# Patient Record
Sex: Female | Born: 1985 | Race: White | Hispanic: No | Marital: Married | Smoking: Never smoker
Health system: Southern US, Community
[De-identification: ages and names within clinical notes are randomized; demographics above are authoritative.]

## PROBLEM LIST (undated history)

## (undated) DIAGNOSIS — Z9889 Other specified postprocedural states: Secondary | ICD-10-CM

## (undated) DIAGNOSIS — R112 Nausea with vomiting, unspecified: Secondary | ICD-10-CM

## (undated) DIAGNOSIS — Z789 Other specified health status: Secondary | ICD-10-CM

## (undated) HISTORY — PX: NO PAST SURGERIES: SHX2092

---

## 2012-03-08 NOTE — L&D Delivery Note (Signed)
Delivery Note At 12:58 AM a viable and healthy female was delivered via Vaginal, Spontaneous Delivery (Presentation: Right occiput anterior;  ).  APGAR: 9, 9; weight pending .   Placenta status: Intact, Spontaneous.  Cord:  3 vessel with the following complications: none .  Cord pH: n/a  Anesthesia: Epidural  Episiotomy: None Lacerations: None Suture Repair: none Est. Blood Loss (mL): 350  Mom to postpartum.  Baby to nursery-stable.  [name] 09/20/2012, 1:20 AM

## 2012-03-08 NOTE — L&D Delivery Note (Signed)
I have seen and examined this patient and I agree with the above. Cam Hai 1:27 AM 09/20/2012

## 2012-03-27 ENCOUNTER — Other Ambulatory Visit (HOSPITAL_COMMUNITY)
Admission: RE | Admit: 2012-03-27 | Discharge: 2012-03-27 | Disposition: A | Discharge status: Home / Self Care | Payer: Medicaid Other | Source: Ambulatory Visit | Attending: Obstetrics and Gynecology | Admitting: Obstetrics and Gynecology

## 2012-03-27 DIAGNOSIS — Z113 Encounter for screening for infections with a predominantly sexual mode of transmission: Secondary | ICD-10-CM | POA: Insufficient documentation

## 2012-03-27 DIAGNOSIS — Z01419 Encounter for gynecological examination (general) (routine) without abnormal findings: Secondary | ICD-10-CM | POA: Insufficient documentation

## 2012-03-27 LAB — OB RESULTS CONSOLE VARICELLA ZOSTER ANTIBODY, IGG: Varicella: IMMUNE

## 2012-03-27 LAB — OB RESULTS CONSOLE ABO/RH: RH Type: POSITIVE

## 2012-03-27 LAB — OB RESULTS CONSOLE HEPATITIS B SURFACE ANTIGEN: Hepatitis B Surface Ag: NEGATIVE

## 2012-03-27 LAB — OB RESULTS CONSOLE RUBELLA ANTIBODY, IGM: Rubella: IMMUNE

## 2012-04-09 ENCOUNTER — Emergency Department (HOSPITAL_COMMUNITY)
Admission: EM | Admit: 2012-04-09 | Discharge: 2012-04-09 | Disposition: A | Discharge status: Home / Self Care | Payer: Medicaid Other | Attending: Emergency Medicine | Admitting: Emergency Medicine

## 2012-04-09 ENCOUNTER — Emergency Department (HOSPITAL_COMMUNITY): Payer: Medicaid Other

## 2012-04-09 ENCOUNTER — Encounter (HOSPITAL_COMMUNITY): Payer: Self-pay | Admitting: Emergency Medicine

## 2012-04-09 DIAGNOSIS — O2390 Unspecified genitourinary tract infection in pregnancy, unspecified trimester: Secondary | ICD-10-CM | POA: Insufficient documentation

## 2012-04-09 DIAGNOSIS — O209 Hemorrhage in early pregnancy, unspecified: Secondary | ICD-10-CM | POA: Insufficient documentation

## 2012-04-09 DIAGNOSIS — O4690 Antepartum hemorrhage, unspecified, unspecified trimester: Secondary | ICD-10-CM

## 2012-04-09 DIAGNOSIS — B9689 Other specified bacterial agents as the cause of diseases classified elsewhere: Secondary | ICD-10-CM

## 2012-04-09 DIAGNOSIS — N760 Acute vaginitis: Secondary | ICD-10-CM | POA: Insufficient documentation

## 2012-04-09 DIAGNOSIS — O200 Threatened abortion: Secondary | ICD-10-CM | POA: Insufficient documentation

## 2012-04-09 LAB — URINALYSIS, ROUTINE W REFLEX MICROSCOPIC
Hgb urine dipstick: NEGATIVE
Leukocytes, UA: NEGATIVE
Nitrite: NEGATIVE
Specific Gravity, Urine: 1.01 (ref 1.005–1.030)
Urobilinogen, UA: 0.2 mg/dL (ref 0.0–1.0)

## 2012-04-09 LAB — CBC WITH DIFFERENTIAL/PLATELET
Basophils Absolute: 0 10*3/uL (ref 0.0–0.1)
Basophils Relative: 0 % (ref 0–1)
MCHC: 33.6 g/dL (ref 30.0–36.0)
Monocytes Absolute: 0.7 10*3/uL (ref 0.1–1.0)
Neutro Abs: 5.4 10*3/uL (ref 1.7–7.7)
Neutrophils Relative %: 66 % (ref 43–77)
RDW: 14.1 % (ref 11.5–15.5)

## 2012-04-09 LAB — COMPREHENSIVE METABOLIC PANEL
AST: 13 U/L (ref 0–37)
Albumin: 3.1 g/dL — ABNORMAL LOW (ref 3.5–5.2)
Chloride: 102 mEq/L (ref 96–112)
Creatinine, Ser: 0.51 mg/dL (ref 0.50–1.10)
Potassium: 3.4 mEq/L — ABNORMAL LOW (ref 3.5–5.1)
Total Bilirubin: 0.1 mg/dL — ABNORMAL LOW (ref 0.3–1.2)

## 2012-04-09 LAB — PROTIME-INR: INR: 0.87 (ref 0.00–1.49)

## 2012-04-09 LAB — WET PREP, GENITAL

## 2012-04-09 MED ORDER — METRONIDAZOLE 500 MG PO TABS
2000.0000 mg | ORAL_TABLET | Freq: Once | ORAL | Status: AC
  Administered 2012-04-09: 2000 mg via ORAL
  Filled 2012-04-09: qty 4

## 2012-04-09 NOTE — ED Provider Notes (Signed)
History     CSN: [id number]  Arrival date & time 04/09/12  1714   First MD Initiated Contact with Patient 04/09/12 1736      Chief Complaint  Patient presents with  . Vaginal Bleeding    (Consider location/radiation/quality/duration/timing/severity/associated sxs/prior treatment) HPI Comments: Patient reports vaginal spotting for the past day that is dark red. She is [redacted] weeks gestation confirming ultrasound. States on 2 days ago she had some pink discharge which resolved. Today she saw some dark spots with clots. Denies any abdominal pain, cramps, nausea, vomiting, fever, fluid rest. No dysuria or hematuria. No chest pain or shortness of breath. She is G 3 P2. She has seen someone at [facility].  The history is provided by the patient.    History reviewed. No pertinent past medical history.  History reviewed. No pertinent past surgical history.  No family history on file.  History  Substance Use Topics  . Smoking status: Not on file  . Smokeless tobacco: Not on file  . Alcohol Use: No    OB History    Grav Para Term Preterm Abortions TAB SAB Ect Mult Living   3 2 2       2       Review of Systems  Constitutional: Negative for fever, activity change and appetite change.  HENT: Negative for congestion and rhinorrhea.   Respiratory: Negative for cough, chest tightness and shortness of breath.   Cardiovascular: Negative for chest pain.  Gastrointestinal: Negative for nausea, vomiting and abdominal pain.  Genitourinary: Positive for vaginal bleeding. Negative for dysuria and hematuria.  Musculoskeletal: Negative for back pain.  Skin: Negative for rash.  Neurological: Negative for dizziness and weakness.    Allergies  Review of patient's allergies indicates no known allergies.  Home Medications   Current Outpatient Rx  Name  Route  Sig  Dispense  Refill  . PRENATAL 27-0.8 MG PO TABS   Oral   Take 1 tablet by mouth daily.           BP 109/82   Pulse 121  Temp 98 F (36.7 C) (Oral)  Resp 24  Ht 5' (1.524 m)  Wt 123 lb (55.792 kg)  BMI 24.02 kg/m2  SpO2 100%  LMP 12/08/2011  Physical Exam  Constitutional: She is oriented to person, place, and time. She appears well-developed and well-nourished. No distress.  HENT:  Head: Normocephalic and atraumatic.  Mouth/Throat: Oropharynx is clear and moist. No oropharyngeal exudate.  Eyes: Conjunctivae normal and EOM are normal. Pupils are equal, round, and reactive to light.  Neck: Normal range of motion. Neck supple.  Cardiovascular: Normal rate, regular rhythm and normal heart sounds.   No murmur heard.      Tachycardic  Pulmonary/Chest: Effort normal and breath sounds normal. No respiratory distress.  Abdominal: Soft. There is no tenderness. There is no rebound and no guarding.       Gravid abdomen  Genitourinary: There is no tenderness on the right labia. There is no tenderness on the left labia. Cervix exhibits no motion tenderness. Right adnexum displays no mass and no tenderness. Left adnexum displays no mass and no tenderness. No vaginal discharge found.       No blood in vaginal vault, no bleeding  Musculoskeletal: Normal range of motion. She exhibits no edema and no tenderness.  Neurological: She is alert and oriented to person, place, and time. No cranial nerve deficit. Coordination normal.  Skin: Skin is warm.    ED Course  Procedures (including critical care time)  Labs Reviewed  COMPREHENSIVE METABOLIC PANEL - Abnormal; Notable for the following:    Potassium 3.4 (*)     Glucose, Bld 112 (*)     Albumin 3.1 (*)     Alkaline Phosphatase 37 (*)     All other components within normal limits  CBC WITH DIFFERENTIAL  PROTIME-INR  ABO/RH  URINALYSIS, ROUTINE W REFLEX MICROSCOPIC  GC/CHLAMYDIA PROBE AMP  WET PREP, GENITAL   US Ob Limited  04/09/2012  *RADIOLOGY REPORT*  Clinical Data: Pregnant patient with abdominal pain and bleeding.  LIMITED OBSTETRIC ULTRASOUND   Number of Fetuses: 1 Heart Rate: 144 bpm Movement: Present  Presentation: Breech Placental Location: Posterior right Previa: None Amniotic Fluid (Subjective): Normal  Vertical Pocket 4.0 cm  BPD:  3.9 cm      18 w  0 d         EDC:  09/12/2012.  MATERNAL FINDINGS: Cervix: Closed.  Normal. Uterus/Adnexae:  Normal.  IMPRESSION: Single viable intrauterine pregnancy.  No complicating features. Estimated gestational age [redacted] weeks 0 days.  Recommend followup with non-emergent complete OB 14+ wk US examination for fetal biometric evaluation and anatomic survey if not already performed.   Original Report Authenticated By: Andreas Newport, M.D.      No diagnosis found.    MDM  G3 P2 is [redacted] weeks gestation confirming by outpatient ultrasound on record not available presenting with vaginal spotting for the past day. No cramps, gush of fluid, fever chills or vomiting.   Ultrasound shows viable fetus at 18 weeks' gestation. Heart rate 144.  Hemoglobin 12. A+, not rhogam candidate.  Patient's reassuring workup discussed with patient and husband. Discussed with Dr. Emelda Fear who the patient to call first thing in the morning to be seen tomorrow.  Heart rate has improved to 90s on recheck. Patient denies any abdominal pain, chest pain or shortness of breath.  To call Dr. Emelda Fear tomorrow for an appointment.   Glynn Octave, MD 04/09/12 [phone]

## 2012-04-09 NOTE — ED Notes (Signed)
Pt [redacted] weeks pregnant. Pt spotting intermittently since Friday, but worse today.

## 2012-04-11 LAB — GC/CHLAMYDIA PROBE AMP
CT Probe RNA: NEGATIVE
GC Probe RNA: NEGATIVE

## 2012-05-09 ENCOUNTER — Encounter: Payer: Self-pay | Admitting: *Deleted

## 2012-06-05 ENCOUNTER — Encounter: Payer: Self-pay | Admitting: Obstetrics & Gynecology

## 2012-06-27 ENCOUNTER — Ambulatory Visit (INDEPENDENT_AMBULATORY_CARE_PROVIDER_SITE_OTHER): Payer: Medicaid Other | Admitting: Obstetrics & Gynecology

## 2012-06-27 VITALS — BP 108/60 | Wt 132.0 lb

## 2012-06-27 DIAGNOSIS — Z3480 Encounter for supervision of other normal pregnancy, unspecified trimester: Secondary | ICD-10-CM | POA: Insufficient documentation

## 2012-06-27 DIAGNOSIS — Z331 Pregnant state, incidental: Secondary | ICD-10-CM

## 2012-06-27 DIAGNOSIS — Z1389 Encounter for screening for other disorder: Secondary | ICD-10-CM

## 2012-06-27 DIAGNOSIS — O09299 Supervision of pregnancy with other poor reproductive or obstetric history, unspecified trimester: Secondary | ICD-10-CM

## 2012-06-27 DIAGNOSIS — Z3483 Encounter for supervision of other normal pregnancy, third trimester: Secondary | ICD-10-CM

## 2012-06-27 LAB — POCT URINALYSIS DIPSTICK
Blood, UA: NEGATIVE
Ketones, UA: NEGATIVE
Protein, UA: NEGATIVE

## 2012-06-27 NOTE — Progress Notes (Signed)
Pressure left abdomen

## 2012-06-27 NOTE — Progress Notes (Signed)
BP weight and urine results all reviewed and noted. Patient reports good fetal movement, denies any bleeding and no rupture of membranes symptoms or regular contractions. Patient is without complaints. All questions were answered.  

## 2012-06-27 NOTE — Patient Instructions (Signed)
Pregnancy - Third Trimester  The third trimester of pregnancy (the last 3 months) is a period of the most rapid growth for you and your baby. The baby approaches a length of 20 inches and a weight of 6 to 10 pounds. The baby is adding on fat and getting ready for life outside your body. While inside, babies have periods of sleeping and waking, suck their thumbs, and hiccups. You can often feel small contractions of the uterus. This is false labor. It is also called Braxton-Hicks contractions. This is like a practice for labor. The usual problems in this stage of pregnancy include more difficulty breathing, swelling of the hands and feet from water retention, and having to urinate more often because of the uterus and baby pressing on your bladder.   PRENATAL EXAMS  · Blood work may continue to be done during prenatal exams. These tests are done to check on your health and the probable health of your baby. Blood work is used to follow your blood levels (hemoglobin). Anemia (low hemoglobin) is common during pregnancy. Iron and vitamins are given to help prevent this. You may also continue to be checked for diabetes. Some of the past blood tests may be done again.  · The size of the uterus is measured during each visit. This makes sure your baby is growing properly according to your pregnancy dates.  · Your blood pressure is checked every prenatal visit. This is to make sure you are not getting toxemia.  · Your urine is checked every prenatal visit for infection, diabetes and protein.  · Your weight is checked at each visit. This is done to make sure gains are happening at the suggested rate and that you and your baby are growing normally.  · Sometimes, an ultrasound is performed to confirm the position and the proper growth and development of the baby. This is a test done that bounces harmless sound waves off the baby so your caregiver can more accurately determine due dates.  · Discuss the type of pain medication and  anesthesia you will have during your labor and delivery.  · Discuss the possibility and anesthesia if a Cesarean Section might be necessary.  · Inform your caregiver if there is any mental or physical violence at home.  Sometimes, a specialized non-stress test, contraction stress test and biophysical profile are done to make sure the baby is not having a problem. Checking the amniotic fluid surrounding the baby is called an amniocentesis. The amniotic fluid is removed by sticking a needle into the belly (abdomen). This is sometimes done near the end of pregnancy if an early delivery is required. In this case, it is done to help make sure the baby's lungs are mature enough for the baby to live outside of the womb. If the lungs are not mature and it is unsafe to deliver the baby, an injection of cortisone medication is given to the mother 1 to 2 days before the delivery. This helps the baby's lungs mature and makes it safer to deliver the baby.  CHANGES OCCURING IN THE THIRD TRIMESTER OF PREGNANCY  Your body goes through many changes during pregnancy. They vary from person to person. Talk to your caregiver about changes you notice and are concerned about.  · During the last trimester, you have probably had an increase in your appetite. It is normal to have cravings for certain foods. This varies from person to person and pregnancy to pregnancy.  · You may begin to   get stretch marks on your hips, abdomen, and breasts. These are normal changes in the body during pregnancy. There are no exercises or medications to take which prevent this change.  · Constipation may be treated with a stool softener or adding bulk to your diet. Drinking lots of fluids, fiber in vegetables, fruits, and whole grains are helpful.  · Exercising is also helpful. If you have been very active up until your pregnancy, most of these activities can be continued during your pregnancy. If you have been less active, it is helpful to start an exercise  program such as walking. Consult your caregiver before starting exercise programs.  · Avoid all smoking, alcohol, un-prescribed drugs, herbs and "street drugs" during your pregnancy. These chemicals affect the formation and growth of the baby. Avoid chemicals throughout the pregnancy to ensure the delivery of a healthy infant.  · Backache, varicose veins and hemorrhoids may develop or get worse.  · You will tire more easily in the third trimester, which is normal.  · The baby's movements may be stronger and more often.  · You may become short of breath easily.  · Your belly button may stick out.  · A yellow discharge may leak from your breasts called colostrum.  · You may have a bloody mucus discharge. This usually occurs a few days to a week before labor begins.  HOME CARE INSTRUCTIONS   · Keep your caregiver's appointments. Follow your caregiver's instructions regarding medication use, exercise, and diet.  · During pregnancy, you are providing food for you and your baby. Continue to eat regular, well-balanced meals. Choose foods such as meat, fish, milk and other low fat dairy products, vegetables, fruits, and whole-grain breads and cereals. Your caregiver will tell you of the ideal weight gain.  · A physical sexual relationship may be continued throughout pregnancy if there are no other problems such as early (premature) leaking of amniotic fluid from the membranes, vaginal bleeding, or belly (abdominal) pain.  · Exercise regularly if there are no restrictions. Check with your caregiver if you are unsure of the safety of your exercises. Greater weight gain will occur in the last 2 trimesters of pregnancy. Exercising helps:  · Control your weight.  · Get you in shape for labor and delivery.  · You lose weight after you deliver.  · Rest a lot with legs elevated, or as needed for leg cramps or low back pain.  · Wear a good support or jogging bra for breast tenderness during pregnancy. This may help if worn during  sleep. Pads or tissues may be used in the bra if you are leaking colostrum.  · Do not use hot tubs, steam rooms, or saunas.  · Wear your seat belt when driving. This protects you and your baby if you are in an accident.  · Avoid raw meat, cat litter boxes and soil used by cats. These carry germs that can cause birth defects in the baby.  · It is easier to loose urine during pregnancy. Tightening up and strengthening the pelvic muscles will help with this problem. You can practice stopping your urination while you are going to the bathroom. These are the same muscles you need to strengthen. It is also the muscles you would use if you were trying to stop from passing gas. You can practice tightening these muscles up 10 times a set and repeating this about 3 times per day. Once you know what muscles to tighten up, do not perform these   exercises during urination. It is more likely to cause an infection by backing up the urine.  · Ask for help if you have financial, counseling or nutritional needs during pregnancy. Your caregiver will be able to offer counseling for these needs as well as refer you for other special needs.  · Make a list of emergency phone numbers and have them available.  · Plan on getting help from family or friends when you go home from the hospital.  · Make a trial run to the hospital.  · Take prenatal classes with the father to understand, practice and ask questions about the labor and delivery.  · Prepare the baby's room/nursery.  · Do not travel out of the city unless it is absolutely necessary and with the advice of your caregiver.  · Wear only low or no heal shoes to have better balance and prevent falling.  MEDICATIONS AND DRUG USE IN PREGNANCY  · Take prenatal vitamins as directed. The vitamin should contain 1 milligram of folic acid. Keep all vitamins out of reach of children. Only a couple vitamins or tablets containing iron may be fatal to a baby or young child when ingested.  · Avoid use  of all medications, including herbs, over-the-counter medications, not prescribed or suggested by your caregiver. Only take over-the-counter or prescription medicines for pain, discomfort, or fever as directed by your caregiver. Do not use aspirin, ibuprofen (Motrin®, Advil®, Nuprin®) or naproxen (Aleve®) unless OK'd by your caregiver.  · Let your caregiver also know about herbs you may be using.  · Alcohol is related to a number of birth defects. This includes fetal alcohol syndrome. All alcohol, in any form, should be avoided completely. Smoking will cause low birth rate and premature babies.  · Street/illegal drugs are very harmful to the baby. They are absolutely forbidden. A baby born to an addicted mother will be addicted at birth. The baby will go through the same withdrawal an adult does.  SEEK MEDICAL CARE IF:  You have any concerns or worries during your pregnancy. It is better to call with your questions if you feel they cannot wait, rather than worry about them.  DECISIONS ABOUT CIRCUMCISION  You may or may not know the sex of your baby. If you know your baby is a boy, it may be time to think about circumcision. Circumcision is the removal of the foreskin of the penis. This is the skin that covers the sensitive end of the penis. There is no proven medical need for this. Often this decision is made on what is popular at the time or based upon religious beliefs and social issues. You can discuss these issues with your caregiver or pediatrician.  SEEK IMMEDIATE MEDICAL CARE IF:   · An unexplained oral temperature above 102° F (38.9° C) develops, or as your caregiver suggests.  · You have leaking of fluid from the vagina (birth canal). If leaking membranes are suspected, take your temperature and tell your caregiver of this when you call.  · There is vaginal spotting, bleeding or passing clots. Tell your caregiver of the amount and how many pads are used.  · You develop a bad smelling vaginal discharge with  a change in the color from clear to white.  · You develop vomiting that lasts more than 24 hours.  · You develop chills or fever.  · You develop shortness of breath.  · You develop burning on urination.  · You loose more than 2 pounds of weight   or gain more than 2 pounds of weight or as suggested by your caregiver.  · You notice sudden swelling of your face, hands, and feet or legs.  · You develop belly (abdominal) pain. Round ligament discomfort is a common non-cancerous (benign) cause of abdominal pain in pregnancy. Your caregiver still must evaluate you.  · You develop a severe headache that does not go away.  · You develop visual problems, blurred or double vision.  · If you have not felt your baby move for more than 1 hour. If you think the baby is not moving as much as usual, eat something with sugar in it and lie down on your left side for an hour. The baby should move at least 4 to 5 times per hour. Call right away if your baby moves less than that.  · You fall, are in a car accident or any kind of trauma.  · There is mental or physical violence at home.  Document Released: 02/16/2001 Document Revised: 05/17/2011 Document Reviewed: 08/21/2008  [employer]® Patient Information ©2013 [employer].

## 2012-07-05 ENCOUNTER — Other Ambulatory Visit: Payer: Medicaid Other

## 2012-07-05 DIAGNOSIS — Z3490 Encounter for supervision of normal pregnancy, unspecified, unspecified trimester: Secondary | ICD-10-CM

## 2012-07-06 LAB — CBC
HCT: 37 % (ref 36.0–46.0)
MCH: 30.9 pg (ref 26.0–34.0)
MCV: 94.6 fL (ref 78.0–100.0)
Platelets: 147 10*3/uL — ABNORMAL LOW (ref 150–400)
RDW: 13.1 % (ref 11.5–15.5)

## 2012-07-06 LAB — HSV 2 ANTIBODY, IGG: HSV 2 Glycoprotein G Ab, IgG: <0.1 IV

## 2012-07-06 LAB — ANTIBODY SCREEN: Antibody Screen: NEGATIVE

## 2012-07-06 LAB — GLUCOSE TOLERANCE, 2 HOURS W/ 1HR: Glucose, 1 hour: 158 mg/dL (ref 70–170)

## 2012-07-18 ENCOUNTER — Encounter: Payer: Medicaid Other | Admitting: Women's Health

## 2012-07-25 ENCOUNTER — Encounter: Payer: Medicaid Other | Admitting: Women's Health

## 2012-08-03 ENCOUNTER — Ambulatory Visit (INDEPENDENT_AMBULATORY_CARE_PROVIDER_SITE_OTHER): Payer: Medicaid Other | Admitting: Obstetrics and Gynecology

## 2012-08-03 VITALS — BP 118/80 | Wt 130.8 lb

## 2012-08-03 DIAGNOSIS — O09299 Supervision of pregnancy with other poor reproductive or obstetric history, unspecified trimester: Secondary | ICD-10-CM

## 2012-08-03 DIAGNOSIS — Z3493 Encounter for supervision of normal pregnancy, unspecified, third trimester: Secondary | ICD-10-CM

## 2012-08-03 DIAGNOSIS — Z1389 Encounter for screening for other disorder: Secondary | ICD-10-CM

## 2012-08-03 DIAGNOSIS — Z331 Pregnant state, incidental: Secondary | ICD-10-CM

## 2012-08-03 LAB — POCT URINALYSIS DIPSTICK
Blood, UA: NEGATIVE
Glucose, UA: NEGATIVE
Nitrite, UA: NEGATIVE

## 2012-08-03 NOTE — Progress Notes (Signed)
Tightness around "belly button" no bleeding. More fetal activity. IMP: normal pregnancy fetal activity. Good FM, NO c/o. A: routine Prenatal visit. jvf

## 2012-08-17 ENCOUNTER — Encounter: Payer: Self-pay | Admitting: Advanced Practice Midwife

## 2012-08-17 ENCOUNTER — Ambulatory Visit (INDEPENDENT_AMBULATORY_CARE_PROVIDER_SITE_OTHER): Payer: Medicaid Other | Admitting: Advanced Practice Midwife

## 2012-08-17 VITALS — BP 108/80 | Wt 132.0 lb

## 2012-08-17 DIAGNOSIS — O09299 Supervision of pregnancy with other poor reproductive or obstetric history, unspecified trimester: Secondary | ICD-10-CM

## 2012-08-17 DIAGNOSIS — Z1389 Encounter for screening for other disorder: Secondary | ICD-10-CM

## 2012-08-17 DIAGNOSIS — Z3483 Encounter for supervision of other normal pregnancy, third trimester: Secondary | ICD-10-CM

## 2012-08-17 DIAGNOSIS — Z331 Pregnant state, incidental: Secondary | ICD-10-CM

## 2012-08-17 LAB — POCT URINALYSIS DIPSTICK
Blood, UA: NEGATIVE
Glucose, UA: NEGATIVE
Nitrite, UA: NEGATIVE
Protein, UA: NEGATIVE

## 2012-08-17 LAB — OB RESULTS CONSOLE GC/CHLAMYDIA
Chlamydia: NEGATIVE
Gonorrhea: NEGATIVE

## 2012-08-17 NOTE — Addendum Note (Signed)
Addended by: Colen Darling on: 08/17/2012 12:07 PM   Modules accepted: Orders

## 2012-08-17 NOTE — Progress Notes (Signed)
HAVING A LOT OF VAGINAL PRESSURE. Reassured this is normal.   No c/o at this time.  GBS collectedRoutine questions about pregnancy answered.  F/U in 1 weeks for LROB.

## 2012-08-22 ENCOUNTER — Ambulatory Visit (INDEPENDENT_AMBULATORY_CARE_PROVIDER_SITE_OTHER): Payer: Medicaid Other | Admitting: Obstetrics & Gynecology

## 2012-08-22 ENCOUNTER — Encounter: Payer: Self-pay | Admitting: Obstetrics & Gynecology

## 2012-08-22 VITALS — BP 90/60 | Wt 133.0 lb

## 2012-08-22 DIAGNOSIS — O09299 Supervision of pregnancy with other poor reproductive or obstetric history, unspecified trimester: Secondary | ICD-10-CM

## 2012-08-22 DIAGNOSIS — Z331 Pregnant state, incidental: Secondary | ICD-10-CM

## 2012-08-22 DIAGNOSIS — Z1389 Encounter for screening for other disorder: Secondary | ICD-10-CM

## 2012-08-22 LAB — POCT URINALYSIS DIPSTICK
Glucose, UA: NEGATIVE
Ketones, UA: NEGATIVE
Protein, UA: NEGATIVE

## 2012-08-22 LAB — CULTURE, BETA STREP (GROUP B ONLY)

## 2012-08-22 NOTE — Patient Instructions (Signed)
Breastfeeding A change in hormones during your pregnancy causes growth of your breast tissue and an increase in number and size of milk ducts. The hormone prolactin allows proteins, sugars, and fats from your blood supply to make breast milk in your milk-producing glands. The hormone progesterone prevents breast milk from being released before the birth of your baby. After the birth of your baby, your progesterone level decreases allowing breast milk to be released. Thoughts of your baby, as well as his or her sucking or crying, can stimulate the release of milk from the milk-producing glands. Deciding to breastfeed (nurse) is one of the best choices you can make for you and your baby. The information that follows gives a brief review of the benefits, as well as other important skills to know about breastfeeding. BENEFITS OF BREASTFEEDING For your baby  The first milk (colostrum) helps your baby's digestive system function better.   There are antibodies in your milk that help your baby fight off infections.   Your baby has a lower incidence of asthma, allergies, and sudden infant death syndrome (SIDS).   The nutrients in breast milk are better for your baby than infant formulas.  Breast milk improves your baby's brain development.   Your baby will have less gas, colic, and constipation.  Your baby is less likely to develop other conditions, such as childhood obesity, asthma, or diabetes mellitus. For you  Breastfeeding helps develop a very special bond between you and your baby.   Breastfeeding is convenient, always available at the correct temperature, and costs nothing.   Breastfeeding helps to burn calories and helps you lose the weight gained during pregnancy.   Breastfeeding makes your uterus contract back down to normal size faster and slows bleeding following delivery.   Breastfeeding mothers have a lower risk of developing osteoporosis or breast or ovarian cancer later  in life.  BREASTFEEDING FREQUENCY  A healthy, full-term baby may breastfeed as often as every hour or space his or her feedings to every 3 hours. Breastfeeding frequency will vary from baby to baby.   Newborns should be fed no less than every 2 3 hours during the day and every 4 5 hours during the night. You should breastfeed a minimum of 8 feedings in a 24 hour period.  Awaken your baby to breastfeed if it has been 3 4 hours since the last feeding.  Breastfeed when you feel the need to reduce the fullness of your breasts or when your newborn shows signs of hunger. Signs that your baby may be hungry include:  Increased alertness or activity.  Stretching.  Movement of the head from side to side.  Movement of the head and opening of the mouth when the corner of the mouth or cheek is stroked (rooting).  Increased sucking sounds, smacking lips, cooing, sighing, or squeaking.  Hand-to-mouth movements.  Increased sucking of fingers or hands.  Fussing.  Intermittent crying.  Signs of extreme hunger will require calming and consoling before you try to feed your baby. Signs of extreme hunger may include:  Restlessness.  A loud, strong cry.  Screaming.  Frequent feeding will help you make more milk and will help prevent problems, such as sore nipples and engorgement of the breasts.  BREASTFEEDING   Whether lying down or sitting, be sure that the baby's abdomen is facing your abdomen.   Support your breast with 4 fingers under your breast and your thumb above your nipple. Make sure your fingers are well away from   your nipple and your baby's mouth.   Stroke your baby's lips gently with your finger or nipple.   When your baby's mouth is open wide enough, place all of your nipple and as much of the colored area around your nipple (areola) as possible into your baby's mouth.  More areola should be visible above his or her upper lip than below his or her lower lip.  Your  baby's tongue should be between his or her lower gum and your breast.  Ensure that your baby's mouth is correctly positioned around the nipple (latched). Your baby's lips should create a seal on your breast.  Signs that your baby has effectively latched onto your nipple include:  Tugging or sucking without pain.  Swallowing heard between sucks.  Absent click or smacking sound.  Muscle movement above and in front of his or her ears with sucking.  Your baby must suck about 2 3 minutes in order to get your milk. Allow your baby to feed on each breast as long as he or she wants. Nurse your baby until he or she unlatches or falls asleep at the first breast, then offer the second breast.  Signs that your baby is full and satisfied include:  A gradual decrease in the number of sucks or complete cessation of sucking.  Falling asleep.  Extension or relaxation of his or her body.  Retention of a small amount of milk in his or her mouth.  Letting go of your breast by himself or herself.  Signs of effective breastfeeding in you include:  Breasts that have increased firmness, weight, and size prior to feeding.  Breasts that are softer after nursing.  Increased milk volume, as well as a change in milk consistency and color by the 5th day of breastfeeding.  Breast fullness relieved by breastfeeding.  Nipples are not sore, cracked, or bleeding.  If needed, break the suction by putting your finger into the corner of your baby's mouth and sliding your finger between his or her gums. Then, remove your breast from his or her mouth.  It is common for babies to spit up a small amount after a feeding.  Babies often swallow air during feeding. This can make babies fussy. Burping your baby between breasts can help with this.  Vitamin D supplements are recommended for babies who get only breast milk.  Avoid using a pacifier during your baby's first 4 6 weeks.  Avoid supplemental feedings of  water, formula, or juice in place of breastfeeding. Breast milk is all the food your baby needs. It is not necessary for your baby to have water or formula. Your breasts will make more milk if supplemental feedings are avoided during the early weeks. HOW TO TELL WHETHER YOUR BABY IS GETTING ENOUGH BREAST MILK Wondering whether or not your baby is getting enough milk is a common concern among mothers. You can be assured that your baby is getting enough milk if:   Your baby is actively sucking and you hear swallowing.   Your baby seems relaxed and satisfied after a feeding.   Your baby nurses at least 8 12 times in a 24 hour time period.  During the first 3 5 days of age:  Your baby is wetting at least 3 5 diapers in a 24 hour period. The urine should be clear and pale yellow.  Your baby is having at least 3 4 stools in a 24 hour period. The stool should be soft and yellow.  At   5 7 days of age, your baby is having at least 3 6 stools in a 24 hour period. The stool should be seedy and yellow by 5 days of age.  Your baby has a weight loss less than 7 10% during the first 3 days of age.  Your baby does not lose weight after 3 7 days of age.  Your baby gains 4 7 ounces each week after he or she is 4 days of age.  Your baby gains weight by 5 days of age and is back to birth weight within 2 weeks. ENGORGEMENT In the first week after your baby is born, you may experience extremely full breasts (engorgement). When engorged, your breasts may feel heavy, warm, or tender to the touch. Engorgement peaks within 24 48 hours after delivery of your baby.  Engorgement may be reduced by:  Continuing to breastfeed.  Increasing the frequency of breastfeeding.  Taking warm showers or applying warm, moist heat to your breasts just before each feeding. This increases circulation and helps the milk flow.   Gently massaging your breast before and during the feedings. With your fingertips, massage from  your chest wall towards your nipple in a circular motion.   Ensuring that your baby empties at least one breast at every feeding. It also helps to start the next feeding on the opposite breast.   Expressing breast milk by hand or by using a breast pump to empty the breasts if your baby is sleepy, or not nursing well. You may also want to express milk if you are returning to work oryou feel you are getting engorged.  Ensuring your baby is latched on and positioned properly while breastfeeding. If you follow these suggestions, your engorgement should improve in 24 48 hours. If you are still experiencing difficulty, call your lactation consultant or caregiver.  CARING FOR YOURSELF Take care of your breasts.  Bathe or shower daily.   Avoid using soap on your nipples.   Wear a supportive bra. Avoid wearing underwire style bras.  Air dry your nipples for a 3 4minutes after each feeding.   Use only cotton bra pads to absorb breast milk leakage. Leaking of breast milk between feedings is normal.   Use only pure lanolin on your nipples after nursing. You do not need to wash it off before feeding your baby again. Another option is to express a few drops of breast milk and gently massage that milk into your nipples.  Continue breast self-awareness checks. Take care of yourself.  Eat healthy foods. Alternate 3 meals with 3 snacks.  Avoid foods that you notice affect your baby in a bad way.  Drink milk, fruit juice, and water to satisfy your thirst (about 8 glasses a day).   Rest often, relax, and take your prenatal vitamins to prevent fatigue, stress, and anemia.  Avoid chewing and smoking tobacco.  Avoid alcohol and drug use.  Take over-the-counter and prescribed medicine only as directed by your caregiver or pharmacist. You should always check with your caregiver or pharmacist before taking any new medicine, vitamin, or herbal supplement.  Know that pregnancy is possible while  breastfeeding. If desired, talk to your caregiver about family planning and safe birth control methods that may be used while breastfeeding. SEEK MEDICAL CARE IF:   You feel like you want to stop breastfeeding or have become frustrated with breastfeeding.  You have painful breasts or nipples.  Your nipples are cracked or bleeding.  Your breasts are red, tender,   or warm.  You have a swollen area on either breast.  You have a fever or chills.  You have nausea or vomiting.  You have drainage from your nipples.  Your breasts do not become full before feedings by the 5th day after delivery.  You feel sad and depressed.  Your baby is too sleepy to eat well.  Your baby is having trouble sleeping.   Your baby is wetting less than 3 diapers in a 24 hour period.  Your baby has less than 3 stools in a 24 hour period.  Your baby's skin or the white part of his or her eyes becomes more yellow.   Your baby is not gaining weight by 5 days of age. MAKE SURE YOU:   Understand these instructions.  Will watch your condition.  Will get help right away if you are not doing well or get worse. Document Released: 02/22/2005 Document Revised: 11/17/2011 Document Reviewed: 09/29/2011 [employer] Patient Information 2014 [employer].  

## 2012-08-22 NOTE — Progress Notes (Signed)
BP weight and urine results all reviewed and noted. Patient reports good fetal movement, denies any bleeding and no rupture of membranes symptoms or regular contractions. Patient is without complaints. All questions were answered. GBS positive

## 2012-08-25 ENCOUNTER — Encounter: Payer: Medicaid Other | Admitting: Obstetrics & Gynecology

## 2012-08-29 ENCOUNTER — Encounter: Payer: Medicaid Other | Admitting: Advanced Practice Midwife

## 2012-09-04 ENCOUNTER — Encounter: Payer: Medicaid Other | Admitting: Obstetrics & Gynecology

## 2012-09-05 ENCOUNTER — Encounter: Payer: Self-pay | Admitting: Obstetrics & Gynecology

## 2012-09-05 ENCOUNTER — Ambulatory Visit (INDEPENDENT_AMBULATORY_CARE_PROVIDER_SITE_OTHER): Payer: Medicaid Other | Admitting: Obstetrics & Gynecology

## 2012-09-05 VITALS — BP 100/80 | Wt 132.0 lb

## 2012-09-05 DIAGNOSIS — Z3480 Encounter for supervision of other normal pregnancy, unspecified trimester: Secondary | ICD-10-CM

## 2012-09-05 DIAGNOSIS — Z3483 Encounter for supervision of other normal pregnancy, third trimester: Secondary | ICD-10-CM

## 2012-09-05 DIAGNOSIS — Z1389 Encounter for screening for other disorder: Secondary | ICD-10-CM

## 2012-09-05 DIAGNOSIS — O09299 Supervision of pregnancy with other poor reproductive or obstetric history, unspecified trimester: Secondary | ICD-10-CM

## 2012-09-05 LAB — POCT URINALYSIS DIPSTICK
Glucose, UA: NEGATIVE
Ketones, UA: NEGATIVE
Leukocytes, UA: NEGATIVE
Nitrite, UA: NEGATIVE

## 2012-09-05 NOTE — Progress Notes (Signed)
HAVING IRREGULAR CONTRACTIONS. HAD SOME DIARRHEA THIS MORNING.

## 2012-09-05 NOTE — Progress Notes (Signed)
BP weight and urine results all reviewed and noted. Patient reports good fetal movement, denies any bleeding and no rupture of membranes symptoms or regular contractions. Patient is without complaints. All questions were answered.  

## 2012-09-05 NOTE — Patient Instructions (Signed)

## 2012-09-13 ENCOUNTER — Telehealth: Payer: Self-pay | Admitting: Obstetrics & Gynecology

## 2012-09-13 NOTE — Telephone Encounter (Signed)
Voice mailbox not set up yet. JSY

## 2012-09-13 NOTE — Telephone Encounter (Signed)
Spoke with pt. Pt concerned about baby moving a lot. Every so often baby moves constant for 5 minutes. Having some contractions and she sometimes notices the movement then. Contractions will be every 5 minutes then goes to every 8 minutes. If contractions come regular, every 5-7 minutes x 1 hour, [facility]. Advised everything sounds ok at this point. Advised to bring this up at tomorrow's appt. Pt voiced understanding. JSY

## 2012-09-14 ENCOUNTER — Ambulatory Visit (INDEPENDENT_AMBULATORY_CARE_PROVIDER_SITE_OTHER): Payer: Medicaid Other | Admitting: Obstetrics and Gynecology

## 2012-09-14 VITALS — BP 106/64 | Wt 134.2 lb

## 2012-09-14 DIAGNOSIS — Z331 Pregnant state, incidental: Secondary | ICD-10-CM

## 2012-09-14 DIAGNOSIS — Z3493 Encounter for supervision of normal pregnancy, unspecified, third trimester: Secondary | ICD-10-CM

## 2012-09-14 DIAGNOSIS — O09299 Supervision of pregnancy with other poor reproductive or obstetric history, unspecified trimester: Secondary | ICD-10-CM

## 2012-09-14 DIAGNOSIS — Z1389 Encounter for screening for other disorder: Secondary | ICD-10-CM

## 2012-09-14 LAB — POCT URINALYSIS DIPSTICK
Blood, UA: NEGATIVE
Glucose, UA: NEGATIVE
Leukocytes, UA: NEGATIVE
Nitrite, UA: NEGATIVE

## 2012-09-14 NOTE — Progress Notes (Signed)
No complaints at this time. Good fm, Will schedule IOL for 41 weeks if no spont labor by next WednesdayIOL scheduled for 7:30 AM next wednesday

## 2012-09-14 NOTE — Patient Instructions (Signed)
INduction scheduled for 7:30 next wednesday

## 2012-09-15 ENCOUNTER — Telehealth (HOSPITAL_COMMUNITY): Payer: Self-pay | Admitting: *Deleted

## 2012-09-15 NOTE — Telephone Encounter (Signed)
Preadmission screen  

## 2012-09-19 ENCOUNTER — Inpatient Hospital Stay (HOSPITAL_COMMUNITY): Payer: Medicaid Other | Admitting: Anesthesiology

## 2012-09-19 ENCOUNTER — Encounter (HOSPITAL_COMMUNITY): Payer: Self-pay | Admitting: Anesthesiology

## 2012-09-19 ENCOUNTER — Encounter (HOSPITAL_COMMUNITY): Payer: Self-pay

## 2012-09-19 ENCOUNTER — Inpatient Hospital Stay (HOSPITAL_COMMUNITY)
Admission: AD | Admit: 2012-09-19 | Discharge: 2012-09-21 | DRG: 775 | Disposition: A | Discharge status: Home / Self Care | Payer: Medicaid Other | Source: Ambulatory Visit | Attending: Family Medicine | Admitting: Family Medicine

## 2012-09-19 DIAGNOSIS — Z3480 Encounter for supervision of other normal pregnancy, unspecified trimester: Secondary | ICD-10-CM

## 2012-09-19 DIAGNOSIS — Z22330 Carrier of Group B streptococcus: Secondary | ICD-10-CM

## 2012-09-19 DIAGNOSIS — O99892 Other specified diseases and conditions complicating childbirth: Principal | ICD-10-CM | POA: Diagnosis present

## 2012-09-19 HISTORY — DX: Other specified health status: Z78.9

## 2012-09-19 LAB — CBC
MCH: 30.9 pg (ref 26.0–34.0)
MCHC: 33.5 g/dL (ref 30.0–36.0)
Platelets: 141 10*3/uL — ABNORMAL LOW (ref 150–400)
RDW: 14.3 % (ref 11.5–15.5)

## 2012-09-19 MED ORDER — ONDANSETRON HCL 4 MG/2ML IJ SOLN: 4.0000 mg | Freq: Four times a day (QID) | INTRAMUSCULAR | Status: DC | PRN

## 2012-09-19 MED ORDER — SODIUM CHLORIDE 0.9 % IV SOLN
2.0000 g | Freq: Once | INTRAVENOUS | Status: AC
  Administered 2012-09-19: 2 g via INTRAVENOUS
  Filled 2012-09-19: qty 2000

## 2012-09-19 MED ORDER — ACETAMINOPHEN 325 MG PO TABS: 650.0000 mg | ORAL_TABLET | ORAL | Status: DC | PRN

## 2012-09-19 MED ORDER — LACTATED RINGERS IV SOLN
INTRAVENOUS | Status: DC
  Administered 2012-09-19 – 2012-09-20 (×3): via INTRAVENOUS

## 2012-09-19 MED ORDER — SODIUM BICARBONATE 8.4 % IV SOLN
INTRAVENOUS | Status: DC | PRN
  Administered 2012-09-19: 5 mL via EPIDURAL

## 2012-09-19 MED ORDER — LACTATED RINGERS IV SOLN
500.0000 mL | INTRAVENOUS | Status: DC | PRN
  Administered 2012-09-19: 1000 mL via INTRAVENOUS

## 2012-09-19 MED ORDER — LACTATED RINGERS IV SOLN: 500.0000 mL | Freq: Once | INTRAVENOUS | Status: DC

## 2012-09-19 MED ORDER — LIDOCAINE HCL (PF) 1 % IJ SOLN
30.0000 mL | INTRAMUSCULAR | Status: DC | PRN
  Filled 2012-09-19 (×2): qty 30

## 2012-09-19 MED ORDER — OXYTOCIN BOLUS FROM INFUSION: 500.0000 mL | INTRAVENOUS | Status: DC

## 2012-09-19 MED ORDER — OXYTOCIN 40 UNITS IN LACTATED RINGERS INFUSION - SIMPLE MED
62.5000 mL/h | INTRAVENOUS | Status: DC
  Administered 2012-09-20: 999 mL/h via INTRAVENOUS
  Administered 2012-09-20: 62.5 mL/h via INTRAVENOUS
  Filled 2012-09-19: qty 1000

## 2012-09-19 MED ORDER — PHENYLEPHRINE 40 MCG/ML (10ML) SYRINGE FOR IV PUSH (FOR BLOOD PRESSURE SUPPORT)
80.0000 ug | PREFILLED_SYRINGE | INTRAVENOUS | Status: DC | PRN
  Filled 2012-09-19: qty 2

## 2012-09-19 MED ORDER — OXYCODONE-ACETAMINOPHEN 5-325 MG PO TABS
1.0000 | ORAL_TABLET | ORAL | Status: DC | PRN
  Filled 2012-09-19: qty 1

## 2012-09-19 MED ORDER — DIPHENHYDRAMINE HCL 50 MG/ML IJ SOLN: 12.5000 mg | INTRAMUSCULAR | Status: DC | PRN

## 2012-09-19 MED ORDER — EPHEDRINE 5 MG/ML INJ
10.0000 mg | INTRAVENOUS | Status: DC | PRN
  Filled 2012-09-19: qty 2
  Filled 2012-09-19: qty 4

## 2012-09-19 MED ORDER — FENTANYL 2.5 MCG/ML BUPIVACAINE 1/10 % EPIDURAL INFUSION (WH - ANES)
14.0000 mL/h | INTRAMUSCULAR | Status: DC | PRN
  Administered 2012-09-19: 14 mL/h via EPIDURAL
  Filled 2012-09-19: qty 125

## 2012-09-19 MED ORDER — CITRIC ACID-SODIUM CITRATE 334-500 MG/5ML PO SOLN: 30.0000 mL | ORAL | Status: DC | PRN

## 2012-09-19 MED ORDER — FLEET ENEMA 7-19 GM/118ML RE ENEM: 1.0000 | ENEMA | RECTAL | Status: DC | PRN

## 2012-09-19 MED ORDER — PHENYLEPHRINE 40 MCG/ML (10ML) SYRINGE FOR IV PUSH (FOR BLOOD PRESSURE SUPPORT)
80.0000 ug | PREFILLED_SYRINGE | INTRAVENOUS | Status: DC | PRN
  Filled 2012-09-19: qty 5
  Filled 2012-09-19: qty 2

## 2012-09-19 MED ORDER — EPHEDRINE 5 MG/ML INJ
10.0000 mg | INTRAVENOUS | Status: DC | PRN
  Filled 2012-09-19: qty 2

## 2012-09-19 MED ORDER — IBUPROFEN 600 MG PO TABS
600.0000 mg | ORAL_TABLET | Freq: Four times a day (QID) | ORAL | Status: DC | PRN
  Filled 2012-09-19: qty 1

## 2012-09-19 NOTE — Anesthesia Preprocedure Evaluation (Signed)

## 2012-09-19 NOTE — MAU Note (Signed)
Contractions every 3-5 minutes all day. Denies leaking of fluid or vaginal bleeding. Was dilated 2 cm last week in office. Positive fetal movement.

## 2012-09-19 NOTE — Anesthesia Procedure Notes (Signed)
Epidural Patient location during procedure: OB  Preanesthetic Checklist Completed: patient identified, site marked, surgical consent, pre-op evaluation, timeout performed, IV checked, risks and benefits discussed and monitors and equipment checked  Epidural Patient position: sitting Prep: site prepped and draped and DuraPrep Patient monitoring: continuous pulse ox and blood pressure Approach: midline Injection technique: LOR air  Needle:  Needle type: Tuohy  Needle gauge: 17 G Needle length: 9 cm and 9 Needle insertion depth: 4 cm Catheter type: closed end flexible Catheter size: 19 Gauge Catheter at skin depth: 9 cm Test dose: negative  Assessment Events: blood not aspirated, injection not painful, no injection resistance, negative IV test and no paresthesia  Additional Notes Dosing of Epidural:  1st dose, through catheter ............................................. epi 1:200K + Xylocaine 40 mg  2nd dose, through catheter, after waiting 3 minutes.....epi 1:200K + Xylocaine 60 mg    ( 2% Xylo charted as a single dose in Epic Meds for ease of charting; actual dosing was fractionated as above, for saftey's sake)  As each dose occurred, patient was free of IV sx; and patient exhibited no evidence of SA injection.  Patient is more comfortable after epidural dosed. Please see RN's note for documentation of vital signs,and FHR which are stable.  Patient reminded not to try to ambulate with numb legs, and that an RN must be present when she attempts to get up.       

## 2012-09-20 ENCOUNTER — Encounter (HOSPITAL_COMMUNITY): Payer: Self-pay | Admitting: *Deleted

## 2012-09-20 ENCOUNTER — Inpatient Hospital Stay (HOSPITAL_COMMUNITY): Admission: RE | Admit: 2012-09-20 | Payer: Medicaid Other | Source: Ambulatory Visit

## 2012-09-20 DIAGNOSIS — Z22330 Carrier of Group B streptococcus: Secondary | ICD-10-CM

## 2012-09-20 DIAGNOSIS — O9989 Other specified diseases and conditions complicating pregnancy, childbirth and the puerperium: Secondary | ICD-10-CM

## 2012-09-20 LAB — RPR: RPR Ser Ql: NONREACTIVE

## 2012-09-20 MED ORDER — LANOLIN HYDROUS EX OINT: TOPICAL_OINTMENT | CUTANEOUS | Status: DC | PRN

## 2012-09-20 MED ORDER — ONDANSETRON HCL 4 MG PO TABS: 4.0000 mg | ORAL_TABLET | ORAL | Status: DC | PRN

## 2012-09-20 MED ORDER — TETANUS-DIPHTH-ACELL PERTUSSIS 5-2.5-18.5 LF-MCG/0.5 IM SUSP
0.5000 mL | Freq: Once | INTRAMUSCULAR | Status: AC
  Administered 2012-09-21: 0.5 mL via INTRAMUSCULAR

## 2012-09-20 MED ORDER — BENZOCAINE-MENTHOL 20-0.5 % EX AERO
1.0000 "application " | INHALATION_SPRAY | CUTANEOUS | Status: DC | PRN
  Administered 2012-09-20: 1 via TOPICAL
  Filled 2012-09-20: qty 56

## 2012-09-20 MED ORDER — ONDANSETRON HCL 4 MG/2ML IJ SOLN: 4.0000 mg | INTRAMUSCULAR | Status: DC | PRN

## 2012-09-20 MED ORDER — DIPHENHYDRAMINE HCL 25 MG PO CAPS: 25.0000 mg | ORAL_CAPSULE | Freq: Four times a day (QID) | ORAL | Status: DC | PRN

## 2012-09-20 MED ORDER — ZOLPIDEM TARTRATE 5 MG PO TABS: 5.0000 mg | ORAL_TABLET | Freq: Every evening | ORAL | Status: DC | PRN

## 2012-09-20 MED ORDER — PRENATAL MULTIVITAMIN CH
1.0000 | ORAL_TABLET | Freq: Every day | ORAL | Status: DC
  Filled 2012-09-20 (×2): qty 1

## 2012-09-20 MED ORDER — WITCH HAZEL-GLYCERIN EX PADS
1.0000 "application " | MEDICATED_PAD | CUTANEOUS | Status: DC | PRN
  Administered 2012-09-20: 1 via TOPICAL

## 2012-09-20 MED ORDER — OXYCODONE-ACETAMINOPHEN 5-325 MG PO TABS
1.0000 | ORAL_TABLET | ORAL | Status: DC | PRN
  Administered 2012-09-20 (×2): 1 via ORAL
  Filled 2012-09-20: qty 1

## 2012-09-20 MED ORDER — SIMETHICONE 80 MG PO CHEW: 80.0000 mg | CHEWABLE_TABLET | ORAL | Status: DC | PRN

## 2012-09-20 MED ORDER — DIBUCAINE 1 % RE OINT: 1.0000 "application " | TOPICAL_OINTMENT | RECTAL | Status: DC | PRN

## 2012-09-20 MED ORDER — SENNOSIDES-DOCUSATE SODIUM 8.6-50 MG PO TABS
2.0000 | ORAL_TABLET | Freq: Every day | ORAL | Status: DC
  Administered 2012-09-20: 2 via ORAL

## 2012-09-20 MED ORDER — IBUPROFEN 600 MG PO TABS
600.0000 mg | ORAL_TABLET | Freq: Four times a day (QID) | ORAL | Status: DC
  Administered 2012-09-20 – 2012-09-21 (×7): 600 mg via ORAL
  Filled 2012-09-20 (×6): qty 1

## 2012-09-20 NOTE — Anesthesia Postprocedure Evaluation (Signed)
Anesthesia Post Note  Patient: Courtney Daniel  Procedure(s) Performed: * No procedures listed *  Anesthesia type: Epidural  Patient location: Mother/Baby  Post pain: Pain level controlled  Post assessment: Post-op Vital signs reviewed  Last Vitals:  Filed Vitals:   09/20/12 0810  BP: 94/61  Pulse: 87  Temp: 36.6 C  Resp: 18    Post vital signs: Reviewed  Level of consciousness:alert  Complications: No apparent anesthesia complications

## 2012-09-20 NOTE — Progress Notes (Signed)
UR chart review completed.  

## 2012-09-20 NOTE — Progress Notes (Signed)
Courtney Daniel is a 27 y.o. [phone] at [redacted]w[redacted]d admitted for active labor  Subjective: Patient comfortable  Objective: BP 114/83  Pulse 80  Temp(Src) 98.2 F (36.8 C) (Oral)  Resp 18  Ht 5' (1.524 m)  Wt 60.328 kg (133 lb)  BMI 25.97 kg/m2  SpO2 96%  LMP 12/08/2011   Total I/O In: -  Out: 750 [Urine:750]  FHT:  FHR: 110 bpm, variability: moderate,  accelerations:  Present,  decelerations:  Absent UC:   regular, every 2 minutes SVE:   Dilation: 10 Effacement (%): 100 Station: -1 Exam by:: Waynetta Sandy, MD  Labs: Lab Results  Component Value Date   WBC 8.8 09/19/2012   HGB 13.5 09/19/2012   HCT 40.3 09/19/2012   MCV 92.2 09/19/2012   PLT 141* 09/19/2012    Assessment / Plan: Spontaneous labor, progressing normally. Bloody show. AROM, clear and small amount of fluid.  Labor: Progressing normally Preeclampsia:  no signs or symptoms of toxicity Fetal Wellbeing:  Category I Pain Control:  Epidural I/D:  n/a Anticipated MOD:  NSVD  Courtney Daniel 09/20/2012, 12:37 AM

## 2012-09-20 NOTE — H&P (Signed)
Courtney Daniel is a 27 y.o. female [id number] at 106.6 presenting in active labor. Maternal Medical History:  Reason for admission: Contractions.   Contractions: Onset was 6-12 hours ago.   Frequency: regular.   Perceived severity is moderate.    Fetal activity: Perceived fetal activity is normal.   Last perceived fetal movement was within the past hour.    Prenatal complications: no prenatal complications   HPI: Contractions started around 9am, q 1 hour, increased throughout day to q3-36min. Denies LOF, bleeding. Feels baby kicking.   OB History   Grav Para Term Preterm Abortions TAB SAB Ect Mult Living   5 3 3  2  2   3      Past Medical History  Diagnosis Date  . Medical history non-contributory    Past Surgical History  Procedure Laterality Date  . No past surgeries     Family History: family history includes Cancer in her other; Diabetes in her other; Hypertension in her other; and Stroke in her other. Social History:  reports that she has never smoked. She does not have any smokeless tobacco history on file. She reports that she does not drink alcohol or use illicit drugs.   Prenatal Transfer Tool  Maternal Diabetes: No Genetic Screening: Normal Maternal Ultrasounds/Referrals: Normal Fetal Ultrasounds or other Referrals:  None Maternal Substance Abuse:  No Significant Maternal Medications:  None Significant Maternal Lab Results:  Lab values include: Group B Strep positive Other Comments:  None  Review of Systems  Constitutional: Negative for fever and chills.  Eyes: Negative for blurred vision and double vision.  Respiratory: Negative for shortness of breath.   Cardiovascular: Negative for chest pain.  Neurological: Negative for dizziness and headaches.   Dilation: 7 Effacement (%): 90 Station: -1 Exam by:: [employer], RNC Blood pressure 100/66, pulse 87, temperature 97.4 F (36.3 C), temperature source Oral, resp. rate 18, height 5' (1.524 m), weight 60.328 kg  (133 lb), last menstrual period 12/08/2011, SpO2 98.00%, unknown if currently breastfeeding. Maternal Exam:  Uterine Assessment: Contraction strength is moderate.  Contraction frequency is regular.   Abdomen: Patient reports no abdominal tenderness. Fetal presentation: vertex  Pelvis: adequate for delivery.   Cervix: Cervix evaluated by digital exam.     Fetal Exam Fetal Monitor Review: Baseline rate: 120.  Variability: moderate (6-25 bpm).   Pattern: accelerations present and no decelerations.    Fetal State Assessment: Category I - tracings are normal.     Physical Exam  Constitutional: She is oriented to person, place, and time. She appears well-developed and well-nourished.  HENT:  Head: Normocephalic and atraumatic.  Cardiovascular: Normal rate, regular rhythm, normal heart sounds and intact distal pulses.   Respiratory: Effort normal and breath sounds normal.  GI:  Gravid, nontender, fundal height consistent with GA  Neurological: She is alert and oriented to person, place, and time.  Skin: Skin is warm and dry.    Prenatal labs: ABO, Rh: --/--/A POS (02/02 1750) Antibody: NEG (04/30 0936) Rubella: Immune (01/20 0000) RPR: NON REACTIVE (07/15 2000)  HBsAg: Negative (01/20 0000)  HIV: NON REACTIVE (04/30 0936)  GBS: Positive (06/10 0000)   Assessment/Plan: 27 y.o. [id number] at [redacted]w[redacted]d  Active labor, not ruptured Wants Epidural GBS positive, given ampicillin Expectant management for normal SVD    [name] 09/20/2012, 7:31 AM  I have seen and examined this patient and I agree with the above. Cam Hai 8:47 AM 09/20/2012

## 2012-09-21 NOTE — H&P (Signed)
Chart reviewed and agree with management and plan.  

## 2012-09-21 NOTE — Discharge Summary (Signed)
Obstetric Discharge Summary Reason for Admission: onset of labor Prenatal Procedures: none Intrapartum Procedures: spontaneous vaginal delivery Postpartum Procedures: none Complications-Operative and Postpartum: none Hemoglobin  Date Value Range Status  09/19/2012 13.5  12.0 - 15.0 g/dL Final     HCT  Date Value Range Status  09/19/2012 40.3  36.0 - 46.0 % Final    Physical Exam:  General: alert, cooperative and no distress Lochia: appropriate Uterine Fundus: firm Incision: none DVT Evaluation: No evidence of DVT seen on physical exam. No cords or calf tenderness. No significant calf/ankle edema.  Discharge Diagnoses: Term Pregnancy-delivered  Discharge Information: Date: 09/21/2012 Activity: pelvic rest Diet: routine Medications: PNV and Ibuprofen Condition: stable Instructions: refer to practice specific booklet Discharge to: home Follow-up Information   Follow up with [facility] On 10/19/2012.   Contact information:   [address] [place] [address] [phone]      Newborn Data: Live born female  Birth Weight: 7 lb 7 oz (3374 g) APGAR: 9, 9  Home with mother.  Breast and bottle feeding, outpatient circumcision, pill/patch for contraception  [name] 09/21/2012, 7:30 AM  I have seen this patient and agree with the above resident's note.  LEFTWICH-KIRBY, Imaya Duffy Certified Nurse-Midwife

## 2012-09-21 NOTE — Discharge Summary (Signed)
Attestation of Attending Supervision of Advanced Practitioner (CNM/NP): Evaluation and management procedures were performed by the Advanced Practitioner under my supervision and collaboration. I have reviewed the Advanced Practitioner's note and chart, and I agree with the management and plan.  Refoel Palladino H. 10:41 AM   

## 2012-10-18 ENCOUNTER — Ambulatory Visit: Payer: Medicaid Other | Admitting: Obstetrics and Gynecology

## 2012-10-19 ENCOUNTER — Ambulatory Visit: Payer: Medicaid Other | Admitting: Obstetrics and Gynecology

## 2012-11-01 ENCOUNTER — Ambulatory Visit: Payer: Medicaid Other | Admitting: Obstetrics and Gynecology

## 2012-11-03 ENCOUNTER — Ambulatory Visit: Payer: Medicaid Other | Admitting: Obstetrics and Gynecology

## 2012-11-14 ENCOUNTER — Ambulatory Visit (INDEPENDENT_AMBULATORY_CARE_PROVIDER_SITE_OTHER): Payer: Medicaid Other | Admitting: Obstetrics & Gynecology

## 2012-11-14 ENCOUNTER — Encounter: Payer: Self-pay | Admitting: Obstetrics & Gynecology

## 2012-11-14 DIAGNOSIS — Z3202 Encounter for pregnancy test, result negative: Secondary | ICD-10-CM

## 2012-11-14 LAB — POCT URINE PREGNANCY: Preg Test, Ur: NEGATIVE

## 2012-11-14 MED ORDER — NORELGESTROMIN-ETH ESTRADIOL 150-35 MCG/24HR TD PTWK: 1.0000 | MEDICATED_PATCH | TRANSDERMAL | Status: DC

## 2012-11-14 NOTE — Progress Notes (Signed)
Patient ID: Courtney Daniel, female   DOB: [birth year], 27 y.o.   MRN: [id number] L&D Delivery Note signed by [name], MD at 09/20/2012 1:21 AM    Author: [name], MD Service: Obstetrics Author Type: Resident   Filed: 09/20/2012 1:21 AM Note Time: 09/20/2012 1:19 AM     Related Notes: Cosigned by Arabella Merles, CNM filed at 09/20/2012 1:27 AM       Delivery Note  At 12:58 AM a viable and healthy female was delivered via Vaginal, Spontaneous Delivery (Presentation: Right occiput anterior; ). APGAR: 9, 9; weight pending .  Placenta status: Intact, Spontaneous. Cord: 3 vessel with the following complications: none . Cord pH: n/a  Anesthesia: Epidural  Episiotomy: None  Lacerations: None  Suture Repair: none  Est. Blood Loss (mL): 350      Elie stay she is doing well Her postpartum score is 1 She is down to the 116 pounds Her bleeding stopped She is unsure she's had appeared in a She is using condoms for birth control She will also use the Ortho Evra patch as her birth control method That is prescribed a day  Pelvic is normal external female genitalia no lesions healing well Normal involution adnexa negative nontender

## 2013-01-11 ENCOUNTER — Other Ambulatory Visit: Payer: Self-pay

## 2013-03-08 NOTE — L&D Delivery Note (Signed)
Delivery Note Pt progressed steadily and had SROM with clear fluid at 2027.  After a 10 minute 2nd stage, at 2100  a viable female was delivered via  (Presentation: LOA;  ). The shoulders were not forthcoming, so the posterior (left) axilla was grasped with the index finger, and the baby was rotated clockwise into the oblique diameter.  At this point, the (now) anterior shoulder was released, and the baby delivered.  At no time was any traction placed on the baby's head.  APGAR:9/10 , ; weight pending .   Placenta status: intact with 3 vessel cord:  with the following complications: mild shoulder dystocia  Anesthesia: Epidural  Episiotomy: none Lacerations: none Suture Repair: n/a Est. Blood Loss (mL): 200  Mom to postpartum.  Baby to Couplet care / Skin to Skin   All the above was done by Dr. Skeet Simmer under my supervision.  CRESENZO-DISHMAN,Fenix Ruppe 09/13/2013, 9:05 PM

## 2013-03-14 ENCOUNTER — Other Ambulatory Visit: Payer: Self-pay | Admitting: Obstetrics & Gynecology

## 2013-03-14 DIAGNOSIS — O3680X0 Pregnancy with inconclusive fetal viability, not applicable or unspecified: Secondary | ICD-10-CM

## 2013-03-19 ENCOUNTER — Other Ambulatory Visit: Payer: Self-pay | Admitting: Obstetrics & Gynecology

## 2013-03-19 ENCOUNTER — Ambulatory Visit (INDEPENDENT_AMBULATORY_CARE_PROVIDER_SITE_OTHER): Payer: BC Managed Care – PPO

## 2013-03-19 DIAGNOSIS — O09899 Supervision of other high risk pregnancies, unspecified trimester: Secondary | ICD-10-CM

## 2013-03-19 DIAGNOSIS — O0932 Supervision of pregnancy with insufficient antenatal care, second trimester: Secondary | ICD-10-CM

## 2013-03-19 DIAGNOSIS — O3680X0 Pregnancy with inconclusive fetal viability, not applicable or unspecified: Secondary | ICD-10-CM

## 2013-03-19 DIAGNOSIS — O09892 Supervision of other high risk pregnancies, second trimester: Secondary | ICD-10-CM

## 2013-03-19 DIAGNOSIS — O0930 Supervision of pregnancy with insufficient antenatal care, unspecified trimester: Secondary | ICD-10-CM

## 2013-03-19 NOTE — Progress Notes (Signed)
U/S-transabdominal u/s performed, single active fetus, measurements c/w 14+4wks EDD 09/13/2013, cx appears long and closed, bilateral adnexa appears WNL, FHR-154 bpm, posterior Gr 0 placenta

## 2013-03-22 ENCOUNTER — Encounter: Payer: BC Managed Care – PPO | Admitting: Advanced Practice Midwife

## 2013-04-09 ENCOUNTER — Telehealth: Payer: Self-pay | Admitting: Obstetrics and Gynecology

## 2013-04-09 NOTE — Telephone Encounter (Signed)
Pt requesting proof of verification, pt states has an appt on Thursday of this week. Informed pt to ask nurse for letter at that time.

## 2013-04-12 ENCOUNTER — Ambulatory Visit (INDEPENDENT_AMBULATORY_CARE_PROVIDER_SITE_OTHER): Payer: BC Managed Care – PPO | Admitting: Advanced Practice Midwife

## 2013-04-12 ENCOUNTER — Encounter: Payer: Self-pay | Admitting: Advanced Practice Midwife

## 2013-04-12 ENCOUNTER — Other Ambulatory Visit: Payer: Self-pay | Admitting: Advanced Practice Midwife

## 2013-04-12 VITALS — BP 110/40 | Wt 124.0 lb

## 2013-04-12 DIAGNOSIS — Z331 Pregnant state, incidental: Secondary | ICD-10-CM

## 2013-04-12 DIAGNOSIS — O09892 Supervision of other high risk pregnancies, second trimester: Secondary | ICD-10-CM | POA: Insufficient documentation

## 2013-04-12 DIAGNOSIS — Z1389 Encounter for screening for other disorder: Secondary | ICD-10-CM

## 2013-04-12 DIAGNOSIS — O09299 Supervision of pregnancy with other poor reproductive or obstetric history, unspecified trimester: Secondary | ICD-10-CM

## 2013-04-12 DIAGNOSIS — O0930 Supervision of pregnancy with insufficient antenatal care, unspecified trimester: Secondary | ICD-10-CM

## 2013-04-12 DIAGNOSIS — Z3480 Encounter for supervision of other normal pregnancy, unspecified trimester: Secondary | ICD-10-CM

## 2013-04-12 DIAGNOSIS — O99891 Other specified diseases and conditions complicating pregnancy: Secondary | ICD-10-CM

## 2013-04-12 DIAGNOSIS — O9989 Other specified diseases and conditions complicating pregnancy, childbirth and the puerperium: Secondary | ICD-10-CM

## 2013-04-12 DIAGNOSIS — Z23 Encounter for immunization: Secondary | ICD-10-CM

## 2013-04-12 DIAGNOSIS — Z3490 Encounter for supervision of normal pregnancy, unspecified, unspecified trimester: Secondary | ICD-10-CM

## 2013-04-12 LAB — CBC
HCT: 37.4 % (ref 36.0–46.0)
Hemoglobin: 12.4 g/dL (ref 12.0–15.0)
MCH: 30.5 pg (ref 26.0–34.0)
MCHC: 33.2 g/dL (ref 30.0–36.0)
MCV: 91.9 fL (ref 78.0–100.0)
PLATELETS: 186 10*3/uL (ref 150–400)
RBC: 4.07 MIL/uL (ref 3.87–5.11)
RDW: 13.9 % (ref 11.5–15.5)
WBC: 10.2 10*3/uL (ref 4.0–10.5)

## 2013-04-12 LAB — POCT URINALYSIS DIPSTICK
Blood, UA: NEGATIVE
GLUCOSE UA: NEGATIVE
KETONES UA: NEGATIVE
LEUKOCYTES UA: NEGATIVE
Nitrite, UA: NEGATIVE
PROTEIN UA: NEGATIVE

## 2013-04-12 LAB — OB RESULTS CONSOLE GC/CHLAMYDIA
CHLAMYDIA, DNA PROBE: NEGATIVE
GC PROBE AMP, GENITAL: NEGATIVE

## 2013-04-12 MED ORDER — INFLUENZA VAC SPLIT QUAD 0.5 ML IM SUSP
0.5000 mL | Freq: Once | INTRAMUSCULAR | Status: AC
Start: 2013-04-12 — End: 2013-04-12
  Administered 2013-04-12: 0.5 mL via INTRAMUSCULAR

## 2013-04-12 NOTE — Progress Notes (Signed)
  Subjective:    Courtney Daniel is a [id number] [redacted]w[redacted]d being seen today for her first obstetrical visit.  Her obstetrical history is significant for short interval b/t pregnancies and late prenatal care at 18 weeks.  She conceived when last baby was 31 weeks old..  Pregnancy history fully reviewed.  Patient reports no complaints and round ligament pain only.  Filed Vitals:   04/12/13 1406  BP: 110/40  Weight: 124 lb (56.246 kg)    HISTORY: OB History  Gravida Para Term Preterm AB SAB TAB Ectopic Multiple Living  6 3 3  2 2    3     # Outcome Date GA Lbr Len/2nd Weight Sex Delivery Anes PTL Lv  6 CUR           5 TRM 09/20/12 [redacted]w[redacted]d 07:22 / 00:36 7 lb 7 oz (3.374 kg) M SVD EPI  Y  4 TRM 2012 [redacted]w[redacted]d   M SVD EPI  Y  3 SAB 2012 [redacted]w[redacted]d         2 TRM 2010 [redacted]w[redacted]d   M SVD EPI  Y  1 SAB 2009 [redacted]w[redacted]d            Past Medical History  Diagnosis Date  . Medical history non-contributory    Past Surgical History  Procedure Laterality Date  . No past surgeries     Family History  Problem Relation Age of Onset  . Diabetes Other   . Hypertension Other   . Stroke Other   . Cancer Other     lung, pancreatic  . Diverticulitis Mother      Exam                                      System:     Skin: normal coloration and turgor,eczema on hands   Neurologic: oriented, normal, normal mood   Extremities: normal strength, tone, and muscle mass   HEENT PERRLA   Mouth/Teeth mucous membranes moist, pharynx normal without lesions   Neck supple and no masses   Cardiovascular: regular rate and rhythm   Respiratory:  appears well, vitals normal, no respiratory distress, acyanotic, normal RR   Abdomen: soft, non-tender; bowel sounds normal; no masses,  no organomegaly          Assessment:    Pregnancy: [id number] Patient Active Problem List   Diagnosis Date Noted  . Pregnant 04/12/2013  . Short interval between pregnancies complicating pregnancy in second trimester, antepartum 04/12/2013         Plan:     Initial labs drawn. Prenatal vitamins. Problem list reviewed and updated. Genetic Screening discussed Quad Screen: requested.  Ultrasound discussed; fetal survey: requested.  Follow up in 2 weeks.  CRESENZO-DISHMAN,Cloyde Oregel 04/12/2013

## 2013-04-13 LAB — DRUG SCREEN, URINE, NO CONFIRMATION
AMPHETAMINE SCRN UR: NEGATIVE
BENZODIAZEPINES.: NEGATIVE
Barbiturate Quant, Ur: NEGATIVE
Cocaine Metabolites: NEGATIVE
Creatinine,U: 224.3 mg/dL
MARIJUANA METABOLITE: NEGATIVE
METHADONE: NEGATIVE
OPIATE SCREEN, URINE: NEGATIVE
PHENCYCLIDINE (PCP): NEGATIVE
Propoxyphene: NEGATIVE

## 2013-04-13 LAB — AFP, QUAD SCREEN
AFP: 41.4 IU/mL
Curr Gest Age: 18 wks.days
HCG TOTAL: 12551 m[IU]/mL
INH: 107.4 pg/mL
INTERPRETATION-AFP: NEGATIVE
MOM FOR INH: 0.49
MoM for AFP: 0.94
MoM for hCG: 0.62
OPEN SPINA BIFIDA: NEGATIVE
Tri 18 Scr Risk Est: NEGATIVE
Trisomy 18 (Edward) Syndrome Interp.: 1:41500 {titer}
UE3 MOM: 1.48
uE3 Value: 1.2 ng/mL

## 2013-04-13 LAB — URINALYSIS
Bilirubin Urine: NEGATIVE
GLUCOSE, UA: NEGATIVE mg/dL
HGB URINE DIPSTICK: NEGATIVE
Ketones, ur: NEGATIVE mg/dL
NITRITE: NEGATIVE
PH: 6.5 (ref 5.0–8.0)
Protein, ur: NEGATIVE mg/dL
SPECIFIC GRAVITY, URINE: 1.027 (ref 1.005–1.030)
Urobilinogen, UA: 0.2 mg/dL (ref 0.0–1.0)

## 2013-04-13 LAB — HIV ANTIBODY (ROUTINE TESTING W REFLEX): HIV: NONREACTIVE

## 2013-04-13 LAB — ANTIBODY SCREEN: ANTIBODY SCREEN: NEGATIVE

## 2013-04-13 LAB — GC/CHLAMYDIA PROBE AMP
CT Probe RNA: NEGATIVE
GC Probe RNA: NEGATIVE

## 2013-04-13 LAB — RPR

## 2013-04-13 LAB — HEPATITIS B SURFACE ANTIGEN: Hepatitis B Surface Ag: NEGATIVE

## 2013-04-13 LAB — OXYCODONE SCREEN, UA, RFLX CONFIRM: Oxycodone Screen, Ur: NEGATIVE ng/mL

## 2013-04-14 LAB — URINE CULTURE
COLONY COUNT: NO GROWTH
Organism ID, Bacteria: NO GROWTH

## 2013-04-16 ENCOUNTER — Telehealth: Payer: Self-pay | Admitting: *Deleted

## 2013-04-16 DIAGNOSIS — O09892 Supervision of other high risk pregnancies, second trimester: Secondary | ICD-10-CM

## 2013-04-16 DIAGNOSIS — Z3490 Encounter for supervision of normal pregnancy, unspecified, unspecified trimester: Secondary | ICD-10-CM

## 2013-04-26 ENCOUNTER — Other Ambulatory Visit: Payer: BC Managed Care – PPO

## 2013-04-26 NOTE — Telephone Encounter (Signed)
Pt was mailed Proof of pregnancy

## 2013-04-30 ENCOUNTER — Ambulatory Visit (INDEPENDENT_AMBULATORY_CARE_PROVIDER_SITE_OTHER): Payer: BC Managed Care – PPO

## 2013-04-30 DIAGNOSIS — Z3480 Encounter for supervision of other normal pregnancy, unspecified trimester: Secondary | ICD-10-CM

## 2013-04-30 DIAGNOSIS — O09892 Supervision of other high risk pregnancies, second trimester: Secondary | ICD-10-CM

## 2013-04-30 DIAGNOSIS — Z3490 Encounter for supervision of normal pregnancy, unspecified, unspecified trimester: Secondary | ICD-10-CM

## 2013-04-30 DIAGNOSIS — O09299 Supervision of pregnancy with other poor reproductive or obstetric history, unspecified trimester: Secondary | ICD-10-CM

## 2013-04-30 NOTE — Progress Notes (Signed)
Anatomy ultrasound performed today.  Active 20+4 weeks fetus measuring consistent with dates.  Fluid WNL with MVP of 5.37cm.  Posterior placenta Gr 0 without previa.  Cervix is long and closed measuring 4.1cm.  Bilateral adnexae WNL.  No obvious abnormality noted although spine is not well visualized today.   Female fetus.  FHR  142 bpm.

## 2013-05-21 ENCOUNTER — Encounter: Payer: BC Managed Care – PPO | Admitting: Obstetrics and Gynecology

## 2013-05-29 ENCOUNTER — Ambulatory Visit (INDEPENDENT_AMBULATORY_CARE_PROVIDER_SITE_OTHER): Payer: BC Managed Care – PPO | Admitting: Obstetrics and Gynecology

## 2013-05-29 ENCOUNTER — Encounter: Payer: Self-pay | Admitting: Obstetrics and Gynecology

## 2013-05-29 VITALS — BP 100/60 | Wt 134.0 lb

## 2013-05-29 DIAGNOSIS — Z1389 Encounter for screening for other disorder: Secondary | ICD-10-CM

## 2013-05-29 DIAGNOSIS — Z3490 Encounter for supervision of normal pregnancy, unspecified, unspecified trimester: Secondary | ICD-10-CM

## 2013-05-29 DIAGNOSIS — O09299 Supervision of pregnancy with other poor reproductive or obstetric history, unspecified trimester: Secondary | ICD-10-CM

## 2013-05-29 DIAGNOSIS — O9989 Other specified diseases and conditions complicating pregnancy, childbirth and the puerperium: Secondary | ICD-10-CM

## 2013-05-29 DIAGNOSIS — O99891 Other specified diseases and conditions complicating pregnancy: Secondary | ICD-10-CM

## 2013-05-29 DIAGNOSIS — Z331 Pregnant state, incidental: Secondary | ICD-10-CM

## 2013-05-29 DIAGNOSIS — O0930 Supervision of pregnancy with insufficient antenatal care, unspecified trimester: Secondary | ICD-10-CM

## 2013-05-29 LAB — POCT URINALYSIS DIPSTICK
Glucose, UA: NEGATIVE
Ketones, UA: NEGATIVE
NITRITE UA: NEGATIVE
PROTEIN UA: NEGATIVE
RBC UA: NEGATIVE

## 2013-05-29 NOTE — Progress Notes (Signed)
Review of u/s at 20 wk shows suboptimal spine view.. Will recheck spine at 28 wk Good Fm, no bleeding or contractions.  BC Vas vs IUD

## 2013-06-26 ENCOUNTER — Other Ambulatory Visit: Payer: BC Managed Care – PPO

## 2013-06-26 ENCOUNTER — Other Ambulatory Visit: Payer: Self-pay | Admitting: Obstetrics and Gynecology

## 2013-06-26 ENCOUNTER — Ambulatory Visit (INDEPENDENT_AMBULATORY_CARE_PROVIDER_SITE_OTHER): Payer: BC Managed Care – PPO | Admitting: Advanced Practice Midwife

## 2013-06-26 ENCOUNTER — Ambulatory Visit (INDEPENDENT_AMBULATORY_CARE_PROVIDER_SITE_OTHER): Payer: BC Managed Care – PPO

## 2013-06-26 VITALS — BP 106/60 | Wt 137.5 lb

## 2013-06-26 DIAGNOSIS — O358XX0 Maternal care for other (suspected) fetal abnormality and damage, not applicable or unspecified: Secondary | ICD-10-CM

## 2013-06-26 DIAGNOSIS — Z3490 Encounter for supervision of normal pregnancy, unspecified, unspecified trimester: Secondary | ICD-10-CM

## 2013-06-26 DIAGNOSIS — O99891 Other specified diseases and conditions complicating pregnancy: Secondary | ICD-10-CM

## 2013-06-26 DIAGNOSIS — O9989 Other specified diseases and conditions complicating pregnancy, childbirth and the puerperium: Secondary | ICD-10-CM

## 2013-06-26 DIAGNOSIS — Z3480 Encounter for supervision of other normal pregnancy, unspecified trimester: Secondary | ICD-10-CM

## 2013-06-26 DIAGNOSIS — O09299 Supervision of pregnancy with other poor reproductive or obstetric history, unspecified trimester: Secondary | ICD-10-CM

## 2013-06-26 DIAGNOSIS — Z331 Pregnant state, incidental: Secondary | ICD-10-CM

## 2013-06-26 DIAGNOSIS — O0930 Supervision of pregnancy with insufficient antenatal care, unspecified trimester: Secondary | ICD-10-CM

## 2013-06-26 DIAGNOSIS — Z1389 Encounter for screening for other disorder: Secondary | ICD-10-CM

## 2013-06-26 LAB — POCT URINALYSIS DIPSTICK
GLUCOSE UA: NEGATIVE
KETONES UA: NEGATIVE
Leukocytes, UA: NEGATIVE
Nitrite, UA: NEGATIVE
Protein, UA: NEGATIVE
RBC UA: NEGATIVE

## 2013-06-26 LAB — CBC
HCT: 33 % — ABNORMAL LOW (ref 36.0–46.0)
Hemoglobin: 11.2 g/dL — ABNORMAL LOW (ref 12.0–15.0)
MCH: 29.6 pg (ref 26.0–34.0)
MCHC: 33.9 g/dL (ref 30.0–36.0)
MCV: 87.3 fL (ref 78.0–100.0)
PLATELETS: 171 10*3/uL (ref 150–400)
RBC: 3.78 MIL/uL — ABNORMAL LOW (ref 3.87–5.11)
RDW: 13.3 % (ref 11.5–15.5)
WBC: 8.9 10*3/uL (ref 4.0–10.5)

## 2013-06-26 NOTE — Progress Notes (Signed)
U/S(28+5wks)-breech active fetus, approp growth EFW 2 lb 11 oz (47th%tile), FHR-141 BPM, posterior Gr 1 placenta, cx appears closed (4.0cm), bilateral adnexa appears wnl, female fetus, anatomy reviewed no major abnl noted

## 2013-06-26 NOTE — Progress Notes (Signed)
Doing PN2 today. Had u/s today to recheck spine.  See note (normal).    No c/o at this time.  Routine questions about pregnancy answered.  F/U in 4 weeks for Low-risk ob appt .

## 2013-06-27 LAB — ANTIBODY SCREEN: Antibody Screen: NEGATIVE

## 2013-06-27 LAB — GLUCOSE TOLERANCE, 2 HOURS W/ 1HR
GLUCOSE, FASTING: 78 mg/dL (ref 70–99)
Glucose, 1 hour: 127 mg/dL (ref 70–170)
Glucose, 2 hour: 113 mg/dL (ref 70–139)

## 2013-06-27 LAB — HSV 2 ANTIBODY, IGG: HSV 2 Glycoprotein G Ab, IgG: 0.11 IV

## 2013-06-27 LAB — HIV ANTIBODY (ROUTINE TESTING W REFLEX): HIV: NONREACTIVE

## 2013-06-27 LAB — RPR

## 2013-06-28 ENCOUNTER — Encounter: Payer: Self-pay | Admitting: Advanced Practice Midwife

## 2013-07-24 ENCOUNTER — Encounter: Payer: BC Managed Care – PPO | Admitting: Women's Health

## 2013-08-02 ENCOUNTER — Encounter: Payer: BC Managed Care – PPO | Admitting: Obstetrics & Gynecology

## 2013-08-29 ENCOUNTER — Encounter: Payer: BC Managed Care – PPO | Admitting: Advanced Practice Midwife

## 2013-09-13 ENCOUNTER — Encounter (HOSPITAL_COMMUNITY): Payer: BC Managed Care – PPO | Admitting: Anesthesiology

## 2013-09-13 ENCOUNTER — Ambulatory Visit (INDEPENDENT_AMBULATORY_CARE_PROVIDER_SITE_OTHER): Payer: BC Managed Care – PPO | Admitting: Obstetrics & Gynecology

## 2013-09-13 ENCOUNTER — Encounter (HOSPITAL_COMMUNITY): Payer: Self-pay | Admitting: *Deleted

## 2013-09-13 ENCOUNTER — Inpatient Hospital Stay (HOSPITAL_COMMUNITY): Payer: BC Managed Care – PPO | Admitting: Anesthesiology

## 2013-09-13 ENCOUNTER — Inpatient Hospital Stay (HOSPITAL_COMMUNITY)
Admission: AD | Admit: 2013-09-13 | Discharge: 2013-09-15 | DRG: 775 | Disposition: A | Discharge status: Home / Self Care | Payer: BC Managed Care – PPO | Source: Ambulatory Visit | Attending: Obstetrics & Gynecology | Admitting: Obstetrics & Gynecology

## 2013-09-13 ENCOUNTER — Encounter: Payer: Self-pay | Admitting: Obstetrics & Gynecology

## 2013-09-13 VITALS — BP 100/60 | Wt 137.0 lb

## 2013-09-13 DIAGNOSIS — IMO0001 Reserved for inherently not codable concepts without codable children: Secondary | ICD-10-CM

## 2013-09-13 DIAGNOSIS — Z1389 Encounter for screening for other disorder: Secondary | ICD-10-CM

## 2013-09-13 DIAGNOSIS — Z331 Pregnant state, incidental: Secondary | ICD-10-CM

## 2013-09-13 DIAGNOSIS — O0930 Supervision of pregnancy with insufficient antenatal care, unspecified trimester: Secondary | ICD-10-CM

## 2013-09-13 DIAGNOSIS — Z833 Family history of diabetes mellitus: Secondary | ICD-10-CM

## 2013-09-13 DIAGNOSIS — O479 False labor, unspecified: Secondary | ICD-10-CM | POA: Diagnosis present

## 2013-09-13 DIAGNOSIS — Z3480 Encounter for supervision of other normal pregnancy, unspecified trimester: Secondary | ICD-10-CM

## 2013-09-13 DIAGNOSIS — O09299 Supervision of pregnancy with other poor reproductive or obstetric history, unspecified trimester: Secondary | ICD-10-CM

## 2013-09-13 DIAGNOSIS — Z3483 Encounter for supervision of other normal pregnancy, third trimester: Secondary | ICD-10-CM

## 2013-09-13 DIAGNOSIS — Z823 Family history of stroke: Secondary | ICD-10-CM

## 2013-09-13 LAB — CBC
HCT: 33.2 % — ABNORMAL LOW (ref 36.0–46.0)
HEMOGLOBIN: 10.4 g/dL — AB (ref 12.0–15.0)
MCH: 25.7 pg — ABNORMAL LOW (ref 26.0–34.0)
MCHC: 31.3 g/dL (ref 30.0–36.0)
MCV: 82.2 fL (ref 78.0–100.0)
PLATELETS: 132 10*3/uL — AB (ref 150–400)
RBC: 4.04 MIL/uL (ref 3.87–5.11)
RDW: 14.6 % (ref 11.5–15.5)
WBC: 10.2 10*3/uL (ref 4.0–10.5)

## 2013-09-13 LAB — POCT URINALYSIS DIPSTICK
Blood, UA: NEGATIVE
GLUCOSE UA: NEGATIVE
KETONES UA: NEGATIVE
LEUKOCYTES UA: NEGATIVE
Nitrite, UA: NEGATIVE
Protein, UA: NEGATIVE

## 2013-09-13 LAB — GROUP B STREP BY PCR: Group B strep by PCR: NEGATIVE

## 2013-09-13 LAB — OB RESULTS CONSOLE GBS: GBS: NEGATIVE

## 2013-09-13 MED ORDER — OXYTOCIN 40 UNITS IN LACTATED RINGERS INFUSION - SIMPLE MED
62.5000 mL/h | INTRAVENOUS | Status: DC
  Filled 2013-09-13: qty 1000

## 2013-09-13 MED ORDER — ONDANSETRON HCL 4 MG PO TABS: 4.0000 mg | ORAL_TABLET | ORAL | Status: DC | PRN

## 2013-09-13 MED ORDER — OXYTOCIN BOLUS FROM INFUSION
500.0000 mL | INTRAVENOUS | Status: DC
  Administered 2013-09-13: 500 mL via INTRAVENOUS

## 2013-09-13 MED ORDER — ACETAMINOPHEN 325 MG PO TABS: 650.0000 mg | ORAL_TABLET | ORAL | Status: DC | PRN

## 2013-09-13 MED ORDER — ONDANSETRON HCL 4 MG/2ML IJ SOLN: 4.0000 mg | INTRAMUSCULAR | Status: DC | PRN

## 2013-09-13 MED ORDER — PRENATAL MULTIVITAMIN CH
1.0000 | ORAL_TABLET | Freq: Every day | ORAL | Status: DC
  Administered 2013-09-14 – 2013-09-15 (×2): 1 via ORAL
  Filled 2013-09-13 (×2): qty 1

## 2013-09-13 MED ORDER — SENNOSIDES-DOCUSATE SODIUM 8.6-50 MG PO TABS
2.0000 | ORAL_TABLET | ORAL | Status: DC
  Administered 2013-09-15: 2 via ORAL
  Filled 2013-09-13: qty 2

## 2013-09-13 MED ORDER — LACTATED RINGERS IV SOLN: 500.0000 mL | INTRAVENOUS | Status: DC | PRN

## 2013-09-13 MED ORDER — LIDOCAINE HCL (PF) 1 % IJ SOLN
INTRAMUSCULAR | Status: DC | PRN
  Administered 2013-09-13 (×2): 4 mL

## 2013-09-13 MED ORDER — FERROUS SULFATE 325 (65 FE) MG PO TABS
325.0000 mg | ORAL_TABLET | Freq: Two times a day (BID) | ORAL | Status: DC
  Administered 2013-09-14 – 2013-09-15 (×3): 325 mg via ORAL
  Filled 2013-09-13 (×3): qty 1

## 2013-09-13 MED ORDER — FENTANYL 2.5 MCG/ML BUPIVACAINE 1/10 % EPIDURAL INFUSION (WH - ANES)
14.0000 mL/h | INTRAMUSCULAR | Status: DC | PRN
  Administered 2013-09-13: 14 mL/h via EPIDURAL
  Filled 2013-09-13: qty 125

## 2013-09-13 MED ORDER — IBUPROFEN 600 MG PO TABS
600.0000 mg | ORAL_TABLET | Freq: Four times a day (QID) | ORAL | Status: DC
  Administered 2013-09-14 – 2013-09-15 (×7): 600 mg via ORAL
  Filled 2013-09-13 (×7): qty 1

## 2013-09-13 MED ORDER — PHENYLEPHRINE 40 MCG/ML (10ML) SYRINGE FOR IV PUSH (FOR BLOOD PRESSURE SUPPORT)
80.0000 ug | PREFILLED_SYRINGE | INTRAVENOUS | Status: DC | PRN
  Filled 2013-09-13: qty 2
  Filled 2013-09-13: qty 10

## 2013-09-13 MED ORDER — BISACODYL 10 MG RE SUPP: 10.0000 mg | Freq: Every day | RECTAL | Status: DC | PRN

## 2013-09-13 MED ORDER — IBUPROFEN 600 MG PO TABS: 600.0000 mg | ORAL_TABLET | Freq: Four times a day (QID) | ORAL | Status: DC | PRN

## 2013-09-13 MED ORDER — LACTATED RINGERS IV SOLN
INTRAVENOUS | Status: DC
  Administered 2013-09-13: 21:00:00 via INTRAVENOUS

## 2013-09-13 MED ORDER — FLEET ENEMA 7-19 GM/118ML RE ENEM: 1.0000 | ENEMA | Freq: Every day | RECTAL | Status: DC | PRN

## 2013-09-13 MED ORDER — PHENYLEPHRINE 40 MCG/ML (10ML) SYRINGE FOR IV PUSH (FOR BLOOD PRESSURE SUPPORT)
80.0000 ug | PREFILLED_SYRINGE | INTRAVENOUS | Status: DC | PRN
  Filled 2013-09-13: qty 2

## 2013-09-13 MED ORDER — ONDANSETRON HCL 4 MG/2ML IJ SOLN: 4.0000 mg | Freq: Four times a day (QID) | INTRAMUSCULAR | Status: DC | PRN

## 2013-09-13 MED ORDER — WITCH HAZEL-GLYCERIN EX PADS
1.0000 "application " | MEDICATED_PAD | CUTANEOUS | Status: DC | PRN
  Administered 2013-09-14: 1 via TOPICAL

## 2013-09-13 MED ORDER — DIPHENHYDRAMINE HCL 25 MG PO CAPS: 25.0000 mg | ORAL_CAPSULE | Freq: Four times a day (QID) | ORAL | Status: DC | PRN

## 2013-09-13 MED ORDER — OXYTOCIN 40 UNITS IN LACTATED RINGERS INFUSION - SIMPLE MED: 62.5000 mL/h | INTRAVENOUS | Status: DC | PRN

## 2013-09-13 MED ORDER — OXYCODONE-ACETAMINOPHEN 5-325 MG PO TABS: 1.0000 | ORAL_TABLET | ORAL | Status: DC | PRN

## 2013-09-13 MED ORDER — CITRIC ACID-SODIUM CITRATE 334-500 MG/5ML PO SOLN: 30.0000 mL | ORAL | Status: DC | PRN

## 2013-09-13 MED ORDER — MEASLES, MUMPS & RUBELLA VAC ~~LOC~~ INJ: 0.5000 mL | INJECTION | Freq: Once | SUBCUTANEOUS | Status: DC

## 2013-09-13 MED ORDER — LACTATED RINGERS IV SOLN
500.0000 mL | Freq: Once | INTRAVENOUS | Status: AC
  Administered 2013-09-13: 500 mL via INTRAVENOUS

## 2013-09-13 MED ORDER — LANOLIN HYDROUS EX OINT: TOPICAL_OINTMENT | CUTANEOUS | Status: DC | PRN

## 2013-09-13 MED ORDER — FENTANYL 2.5 MCG/ML BUPIVACAINE 1/10 % EPIDURAL INFUSION (WH - ANES)
INTRAMUSCULAR | Status: DC | PRN
  Administered 2013-09-13: 12 mL/h via EPIDURAL

## 2013-09-13 MED ORDER — BENZOCAINE-MENTHOL 20-0.5 % EX AERO
1.0000 "application " | INHALATION_SPRAY | CUTANEOUS | Status: DC | PRN
  Administered 2013-09-14: 1 via TOPICAL
  Filled 2013-09-13: qty 56

## 2013-09-13 MED ORDER — METHYLERGONOVINE MALEATE 0.2 MG PO TABS: 0.2000 mg | ORAL_TABLET | ORAL | Status: DC | PRN

## 2013-09-13 MED ORDER — EPHEDRINE 5 MG/ML INJ
10.0000 mg | INTRAVENOUS | Status: DC | PRN
  Filled 2013-09-13: qty 2

## 2013-09-13 MED ORDER — TETANUS-DIPHTH-ACELL PERTUSSIS 5-2.5-18.5 LF-MCG/0.5 IM SUSP: 0.5000 mL | Freq: Once | INTRAMUSCULAR | Status: DC

## 2013-09-13 MED ORDER — ZOLPIDEM TARTRATE 5 MG PO TABS: 5.0000 mg | ORAL_TABLET | Freq: Every evening | ORAL | Status: DC | PRN

## 2013-09-13 MED ORDER — DIPHENHYDRAMINE HCL 50 MG/ML IJ SOLN: 12.5000 mg | INTRAMUSCULAR | Status: DC | PRN

## 2013-09-13 MED ORDER — OXYCODONE-ACETAMINOPHEN 5-325 MG PO TABS
1.0000 | ORAL_TABLET | ORAL | Status: DC | PRN
  Filled 2013-09-13: qty 1

## 2013-09-13 MED ORDER — LIDOCAINE HCL (PF) 1 % IJ SOLN
30.0000 mL | INTRAMUSCULAR | Status: DC | PRN
  Filled 2013-09-13: qty 30

## 2013-09-13 MED ORDER — DIBUCAINE 1 % RE OINT: 1.0000 "application " | TOPICAL_OINTMENT | RECTAL | Status: DC | PRN

## 2013-09-13 MED ORDER — SIMETHICONE 80 MG PO CHEW: 80.0000 mg | CHEWABLE_TABLET | ORAL | Status: DC | PRN

## 2013-09-13 MED ORDER — METHYLERGONOVINE MALEATE 0.2 MG/ML IJ SOLN: 0.2000 mg | INTRAMUSCULAR | Status: DC | PRN

## 2013-09-13 NOTE — Anesthesia Preprocedure Evaluation (Signed)
Anesthesia Evaluation  Patient identified by MRN, date of birth, ID band Patient awake    Reviewed: Allergy & Precautions, H&P , Patient's Chart, lab work & pertinent test results  Airway Mallampati: III TM Distance: >3 FB Neck ROM: full    Dental no notable dental hx. (+) Teeth Intact   Pulmonary neg pulmonary ROS,  breath sounds clear to auscultation  Pulmonary exam normal       Cardiovascular negative cardio ROS  Rhythm:regular Rate:Normal     Neuro/Psych negative neurological ROS  negative psych ROS   GI/Hepatic negative GI ROS, Neg liver ROS,   Endo/Other  negative endocrine ROS  Renal/GU negative Renal ROS  negative genitourinary   Musculoskeletal   Abdominal Normal abdominal exam  (+)   Peds  Hematology negative hematology ROS (+)   Anesthesia Other Findings   Reproductive/Obstetrics (+) Pregnancy                           Anesthesia Physical Anesthesia Plan  ASA: II  Anesthesia Plan: Epidural   Post-op Pain Management:    Induction:   Airway Management Planned:   Additional Equipment:   Intra-op Plan:   Post-operative Plan:   Informed Consent: I have reviewed the patients History and Physical, chart, labs and discussed the procedure including the risks, benefits and alternatives for the proposed anesthesia with the patient or authorized representative who has indicated his/her understanding and acceptance.     Plan Discussed with: Anesthesiologist  Anesthesia Plan Comments:         Anesthesia Quick Evaluation

## 2013-09-13 NOTE — Progress Notes (Signed)
[id number] [redacted]w[redacted]d Estimated Date of Delivery: 09/13/13  Blood pressure 100/60, weight 137 lb (62.143 kg), last menstrual period 12/07/2012, not currently breastfeeding.   BP weight and urine results all reviewed and noted.  Please refer to the obstetrical flow sheet for the fundal height and fetal heart rate documentation:  Patient reports good fetal movement, denies any bleeding and no rupture of membranes symptoms or regular contractions. Patient is without complaints. All questions were answered.  Plan:  Continued routine obstetrical care,   Follow up in 6 weeks for pp appointment,  Induction scheduled for 7/16 0730

## 2013-09-13 NOTE — MAU Note (Signed)
Patient presents to MAU with c/o frequent contractions that started at 9am this morning and has progressively gotten worse. 9/10 on pain scale. Denies LOF or VB at this time. Good fetal movement. Was seen in OB office this am and was 4cm; [facility].

## 2013-09-13 NOTE — Addendum Note (Signed)
Addended by: Linton Rump on: 09/13/2013 11:58 AM   Modules accepted: Orders

## 2013-09-13 NOTE — H&P (Signed)
Courtney Daniel is a 28 y.o. female [phone] with IUP at [redacted]w[redacted]d presenting for onset of contractions. Pt states she has been having regular, every 5 minutes contractions, associated with none vaginal bleeding.  Membranes are intact, with active fetal movement.   PNCare at [facility] since 18  wks  Prenatal History/Complications: Pt. Presents today complaining of contractions since leaving her provider's office this am around 11. She says that she went home and began feeling more frequent q24minute contractions. She denies LOF, Bleeding, and has been feeling +FM. She denies Fever, chills, chest pain, SOB. She says that she has had no previous obstetrical complications. She has had two previous SAB's. She has had good PNC and follow up. She has no other complaints.   Past Medical History: Past Medical History  Diagnosis Date  . Medical history non-contributory     Past Surgical History: Past Surgical History  Procedure Laterality Date  . No past surgeries      Obstetrical History: OB History   Grav Para Term Preterm Abortions TAB SAB Ect Mult Living   6 3 3  2  2   3       Gynecological History: OB History   Grav Para Term Preterm Abortions TAB SAB Ect Mult Living   6 3 3  2  2   3       Social History: History   Social History  . Marital Status: Unknown    Spouse Name: N/A    Number of Children: N/A  . Years of Education: N/A   Social History Main Topics  . Smoking status: Never Smoker   . Smokeless tobacco: Never Used  . Alcohol Use: No  . Drug Use: No  . Sexual Activity: Yes    Birth Control/ Protection: None   Other Topics Concern  . None   Social History Narrative  . None    Family History: Family History  Problem Relation Age of Onset  . Diabetes Other   . Hypertension Other   . Stroke Other   . Cancer Other     lung, pancreatic  . Diverticulitis Mother     Allergies: No Known Allergies  Prescriptions prior to admission  Medication Sig Dispense Refill  .  Prenatal Vit-Fe Fumarate-FA (PRENATAL MULTIVITAMIN) TABS tablet Take 1 tablet by mouth daily at 12 noon.         Review of Systems  - Per HPI Above.   Blood pressure 99/75, pulse 107, temperature 98 F (36.7 C), temperature source Axillary, resp. rate 18, height 5\' 2"  (1.575 m), weight 61.689 kg (136 lb), last menstrual period 12/07/2012, SpO2 100.00%, not currently breastfeeding. General appearance: alert, cooperative and no distress Lungs: clear to auscultation bilaterally Heart: regular rate and rhythm Abdomen: soft, non-tender; bowel sounds normal Pelvic: 9cm, 100%, -1 at last exam Extremities: Homans sign is negative, no sign of DVT Neurologically grossly intact.  Presentation: cephalic Fetal monitoringBaseline: 135 bpm, Variability: Good {> 6 bpm) and Accelerations: Reactive Uterine activityFrequency: Every 5 minutes Dilation: 9 Effacement (%): 100 Station: -1 Exam by:: Gaspar Garbe, RN    Prenatal labs: ABO, Rh:   A pos Antibody: NEG (04/21 1048) Rubella:  Unknown RPR: NON REAC (04/21 1048)  HBsAg: NEGATIVE (02/05 1445)  HIV: NONREACTIVE (04/21 1048)    Clinic [facility]  FOB johnnie Pfefferkorn  Pap 4/14 normal  GC/CT Initial:     -/-           36+wks:  Genetic Screen NT/IT: Declined  CF screen declined  Anatomic [place] normal  Flu vaccine 04/12/13  Tdap Recommended ~ 28wks  Glucose Screen  2 hr  78/127/113  GBS Negative  Feed Preference Breast x 2 mos  Contraception VAS vs IUD. Husb says he's commited to vas  Circumcision Yes outpt  Childbirth Classes n/a  Pediatrician [facility] .Armstrong MD    Prenatal Transfer Tool  Maternal Diabetes: No Genetic Screening: Negative Maternal Ultrasounds/Referrals: Normal Fetal Ultrasounds or other Referrals:  None Maternal Substance Abuse:  No Significant Maternal Medications:  None Significant Maternal Lab Results: Lab values include: Group B Strep negative     Results for orders placed during the hospital  encounter of 09/13/13 (from the past 24 hour(s))  CBC   Collection Time    09/13/13  5:53 PM      Result Value Ref Range   WBC 10.2  4.0 - 10.5 K/uL   RBC 4.04  3.87 - 5.11 MIL/uL   Hemoglobin 10.4 (*) 12.0 - 15.0 g/dL   HCT 33.2 (*) 36.0 - 46.0 %   MCV 82.2  78.0 - 100.0 fL   MCH 25.7 (*) 26.0 - 34.0 pg   MCHC 31.3  30.0 - 36.0 g/dL   RDW 14.6  11.5 - 15.5 %   Platelets 132 (*) 150 - 400 K/uL  POCT URINALYSIS DIPSTICK   Collection Time    09/13/13 11:57 AM      Result Value Ref Range   Color, UA       Clarity, UA       Glucose, UA neg     Bilirubin, UA       Ketones, UA neg     Spec Grav, UA       Blood, UA neg     pH, UA       Protein, UA neg     Urobilinogen, UA       Nitrite, UA neg     Leukocytes, UA Negative      Assessment: Courtney Daniel is a 28 y.o. [id number] at [redacted]w[redacted]d by L= 20wk u/s  here for SOL.  #Labor: Progressing as expected. SROM. Plan for expectant management to NSVD. #Pain: Epidural #FWB: Cat I #ID:  GBS Neg. No ppx at this time.  #MOF: Bo/Br #MOC: Undecided #Circ:  Yes (inpatient)  Melancon, Caleb G 09/13/2013, 7:32 PM    I have seen and examined this patient and agree the above assessment. CRESENZO-DISHMAN,Franklyn Cafaro 09/13/2013 9:05 PM

## 2013-09-13 NOTE — Anesthesia Procedure Notes (Signed)
Epidural Patient location during procedure: OB Start time: 09/13/2013 6:55 PM  Staffing Anesthesiologist: Nakota Elsen A. Performed by: anesthesiologist   Preanesthetic Checklist Completed: patient identified, site marked, surgical consent, pre-op evaluation, timeout performed, IV checked, risks and benefits discussed and monitors and equipment checked  Epidural Patient position: sitting Prep: site prepped and draped and DuraPrep Patient monitoring: continuous pulse ox and blood pressure Approach: midline Location: L3-L4 Injection technique: LOR air  Needle:  Needle type: Tuohy  Needle gauge: 17 G Needle length: 9 cm and 9 Needle insertion depth: 4 cm Catheter type: closed end flexible Catheter size: 19 Gauge Catheter at skin depth: 9 cm Test dose: negative and Other  Assessment Events: blood not aspirated, injection not painful, no injection resistance, negative IV test and no paresthesia  Additional Notes Patient identified. Risks and benefits discussed including failed block, incomplete  Pain control, post dural puncture headache, nerve damage, paralysis, blood pressure Changes, nausea, vomiting, reactions to medications-both toxic and allergic and post Partum back pain. All questions were answered. Patient expressed understanding and wished to proceed. Sterile technique was used throughout procedure. Epidural site was Dressed with sterile barrier dressing. No paresthesias, signs of intravascular injection Or signs of intrathecal spread were encountered.  Patient was more comfortable after the epidural was dosed. Please see RN's note for documentation of vital signs and FHR which are stable.

## 2013-09-14 LAB — RPR

## 2013-09-14 NOTE — Anesthesia Postprocedure Evaluation (Signed)
Anesthesia Post Note  Patient: Courtney Daniel  Procedure(s) Performed: * No procedures listed *  Anesthesia type: Epidural  Patient location: Mother/Baby  Post pain: Pain level controlled  Post assessment: Post-op Vital signs reviewed  Last Vitals:  Filed Vitals:   09/14/13 0500  BP: 103/62  Pulse: 79  Temp: 36.8 C  Resp: 18    Post vital signs: Reviewed  Level of consciousness:alert  Complications: No apparent anesthesia complications

## 2013-09-14 NOTE — H&P (Signed)

## 2013-09-14 NOTE — Progress Notes (Addendum)
Post Partum Day 1 Subjective: no complaints, up ad lib, voiding and tolerating PO C/o dull abdominal ache relieved by ibuprofen  Objective: Blood pressure 103/62, pulse 79, temperature 98.3 F (36.8 C), temperature source Oral, resp. rate 18, height 5\' 2"  (1.575 m), weight 61.689 kg (136 lb), last menstrual period 12/07/2012, SpO2 100.00%, unknown if currently breastfeeding.  Physical Exam:  General: alert and cooperative Lochia: appropriate Uterine Fundus: firm Incision: n/a DVT Evaluation: No evidence of DVT seen on physical exam.   Recent Labs  09/13/13 1753  HGB 10.4*  HCT 33.2*    Assessment/Plan: Doing well Plan for discharge tomorrow Circumcision prior to discharge Currently bottle feeding, considering breast Contraception considering Depo   LOS: 1 day   Courtney Daniel 09/14/2013, 7:29 AM   I have seen and examined this patient and agree the above assessment. Daniel,Courtney Harju 09/16/2013 6:36 PM

## 2013-09-15 MED ORDER — IBUPROFEN 600 MG PO TABS: 600.0000 mg | ORAL_TABLET | Freq: Four times a day (QID) | ORAL | Status: DC

## 2013-09-15 NOTE — Discharge Summary (Signed)
Obstetric Discharge Summary Reason for Admission: onset of labor Prenatal Procedures: none Intrapartum Procedures: spontaneous vaginal deliveryposterior axilla hooked and infant rotated clockwise  Postpartum Procedures: none Complications-Operative and Postpartum: none Hemoglobin  Date Value Ref Range Status  09/13/2013 10.4* 12.0 - 15.0 g/dL Final     HCT  Date Value Ref Range Status  09/13/2013 33.2* 36.0 - 46.0 % Final    Physical Exam:  General: alert, cooperative and no distress Lochia: appropriate Uterine Fundus: firm Incision: n/a DVT Evaluation: No evidence of DVT seen on physical exam. No cords or calf tenderness.  Discharge Diagnoses: Term Pregnancy-delivered  Pt presented with onset of contractions found to be in active labor. Progressed well. SROM with clear fluid at 2027. After a 10 minute 2nd stage, at 2100 a viable female was delivered.  The shoulders were not forthcoming, so the posterior (left) axilla was grasped with the index finger, and the baby was rotated clockwise into the oblique diameter and delivered with good maternal effort. No complications post partum. Baby underwent circ without difficulty. Breast and bottle feeding. Desires Depo for contraction. Will f/up in 4-6 weeks at [facility].   Discharge Information: Date: 09/15/2013 Activity: unrestricted Diet: routine Medications: Ibuprofen and Colace Condition: stable Instructions: refer to practice specific booklet Discharge to: home   Newborn Data: Live born female  Birth Weight: 7 lb 8.3 oz (3410 g) APGAR: 9, 10  Home with mother.  Langston Masker 09/15/2013, 7:37 AM  I spoke with and examined patient and agree with resident's note and plan of care.  Fredrik Rigger, MD OB Fellow 09/15/2013 11:17 AM

## 2013-09-15 NOTE — Discharge Instructions (Signed)
Vaginal Delivery Care After Refer to this sheet in the next few weeks. These discharge instructions provide you with information on caring for yourself after delivery. Your caregiver may also give you specific instructions. Your treatment has been planned according to the most current medical practices available, but problems sometimes occur. Call your caregiver if you have any problems or questions after you go home. HOME CARE INSTRUCTIONS  Take over-the-counter or prescription medicines only as directed by your caregiver or pharmacist.  Do not drink alcohol, especially if you are breastfeeding or taking medicine to relieve pain.  Do not chew or smoke tobacco.  Do not use illegal drugs.  Continue to use good perineal care. Good perineal care includes:  Wiping your perineum from front to back.  Keeping your perineum clean.  Do not use tampons or douche until your caregiver says it is okay.  Shower, wash your hair, and take tub baths as directed by your caregiver.  Wear a well-fitting bra that provides breast support.  Eat healthy foods.  Drink enough fluids to keep your urine clear or pale yellow.  Eat high-fiber foods such as whole grain cereals and breads, brown rice, beans, and fresh fruits and vegetables every day. These foods may help prevent or relieve constipation.  Follow your cargiver's recommendations regarding resumption of activities such as climbing stairs, driving, lifting, exercising, or traveling.  Talk to your caregiver about resuming sexual activities. Resumption of sexual activities is dependent upon your risk of infection, your rate of healing, and your comfort and desire to resume sexual activity.  Try to have someone help you with your household activities and your newborn for at least a few days after you leave the hospital.  Rest as much as possible. Try to rest or take a nap when your newborn is sleeping.  Increase your activities gradually.  Keep all  of your scheduled postpartum appointments. It is very important to keep your scheduled follow-up appointments. At these appointments, your caregiver will be checking to make sure that you are healing physically and emotionally. SEEK MEDICAL CARE IF:   You are passing large clots from your vagina. Save any clots to show your caregiver.  You have a foul smelling discharge from your vagina.  You have trouble urinating.  You are urinating frequently.  You have pain when you urinate.  You have a change in your bowel movements.  You have increasing redness, pain, or swelling near your vaginal incision (episiotomy) or vaginal tear.  You have pus draining from your episiotomy or vaginal tear.  Your episiotomy or vaginal tear is separating.  You have painful, hard, or reddened breasts.  You have a severe headache.  You have blurred vision or see spots.  You feel sad or depressed.  You have thoughts of hurting yourself or your newborn.  You have questions about your care, the care of your newborn, or medicines.  You are dizzy or lightheaded.  You have a rash.  You have nausea or vomiting.  You were breastfeeding and have not had a menstrual period within 12 weeks after you stopped breastfeeding.  You are not breastfeeding and have not had a menstrual period by the 12th week after delivery.  You have a fever. SEEK IMMEDIATE MEDICAL CARE IF:   You have persistent pain.  You have chest pain.  You have shortness of breath.  You faint.  You have leg pain.  You have stomach pain.  Your vaginal bleeding saturates two or more sanitary pads  in 1 hour. MAKE SURE YOU:   Understand these instructions.  Will watch your condition.  Will get help right away if you are not doing well or get worse. Document Released: 02/20/2000 Document Revised: 11/17/2011 Document Reviewed: 10/20/2011 [facility] Patient Information 2015 [place], [place]. This information is not intended to  replace advice given to you by your health care provider. Make sure you discuss any questions you have with your health care provider.

## 2013-09-16 NOTE — Discharge Summary (Signed)
`````  Attestation of Attending Supervision of Advanced Practitioner: Evaluation and management procedures were performed by the PA/NP/CNM/OB Fellow under my supervision/collaboration. Chart reviewed and agree with management and plan.  Travante Knee V 09/16/2013 11:41 PM

## 2013-09-20 ENCOUNTER — Inpatient Hospital Stay (HOSPITAL_COMMUNITY): Admission: RE | Admit: 2013-09-20 | Payer: Medicaid Other | Source: Ambulatory Visit

## 2013-10-14 ENCOUNTER — Emergency Department (HOSPITAL_COMMUNITY): Payer: BC Managed Care – PPO | Admitting: Anesthesiology

## 2013-10-14 ENCOUNTER — Ambulatory Visit (HOSPITAL_COMMUNITY)
Admission: EM | Admit: 2013-10-14 | Discharge: 2013-10-15 | Disposition: A | Discharge status: Home / Self Care | Payer: BC Managed Care – PPO | Attending: General Surgery | Admitting: General Surgery

## 2013-10-14 ENCOUNTER — Emergency Department (HOSPITAL_COMMUNITY): Payer: BC Managed Care – PPO

## 2013-10-14 ENCOUNTER — Encounter (HOSPITAL_COMMUNITY): Payer: Self-pay | Admitting: Emergency Medicine

## 2013-10-14 ENCOUNTER — Encounter (HOSPITAL_COMMUNITY)
Admission: EM | Disposition: A | Discharge status: Home / Self Care | Payer: Self-pay | Source: Home / Self Care | Attending: Emergency Medicine

## 2013-10-14 ENCOUNTER — Encounter (HOSPITAL_COMMUNITY): Payer: BC Managed Care – PPO | Admitting: Anesthesiology

## 2013-10-14 DIAGNOSIS — K37 Unspecified appendicitis: Secondary | ICD-10-CM

## 2013-10-14 DIAGNOSIS — K3580 Unspecified acute appendicitis: Secondary | ICD-10-CM | POA: Diagnosis not present

## 2013-10-14 DIAGNOSIS — R1031 Right lower quadrant pain: Secondary | ICD-10-CM | POA: Diagnosis present

## 2013-10-14 DIAGNOSIS — R112 Nausea with vomiting, unspecified: Secondary | ICD-10-CM

## 2013-10-14 HISTORY — PX: APPENDECTOMY: SHX54

## 2013-10-14 HISTORY — PX: LAPAROSCOPIC APPENDECTOMY: SHX408

## 2013-10-14 LAB — PREGNANCY, URINE: Preg Test, Ur: NEGATIVE

## 2013-10-14 LAB — URINALYSIS, ROUTINE W REFLEX MICROSCOPIC
BILIRUBIN URINE: NEGATIVE
GLUCOSE, UA: NEGATIVE mg/dL
Ketones, ur: NEGATIVE mg/dL
Nitrite: NEGATIVE
Protein, ur: NEGATIVE mg/dL
Specific Gravity, Urine: 1.015 (ref 1.005–1.030)
Urobilinogen, UA: 0.2 mg/dL (ref 0.0–1.0)
pH: 6.5 (ref 5.0–8.0)

## 2013-10-14 LAB — CBC WITH DIFFERENTIAL/PLATELET
BASOS ABS: 0 10*3/uL (ref 0.0–0.1)
BASOS PCT: 0 % (ref 0–1)
Eosinophils Absolute: 0 10*3/uL (ref 0.0–0.7)
Eosinophils Relative: 0 % (ref 0–5)
HCT: 39.3 % (ref 36.0–46.0)
Hemoglobin: 12.4 g/dL (ref 12.0–15.0)
Lymphocytes Relative: 15 % (ref 12–46)
Lymphs Abs: 1.7 10*3/uL (ref 0.7–4.0)
MCH: 26.3 pg (ref 26.0–34.0)
MCHC: 31.6 g/dL (ref 30.0–36.0)
MCV: 83.4 fL (ref 78.0–100.0)
Monocytes Absolute: 0.8 10*3/uL (ref 0.1–1.0)
Monocytes Relative: 7 % (ref 3–12)
NEUTROS ABS: 8.7 10*3/uL — AB (ref 1.7–7.7)
NEUTROS PCT: 78 % — AB (ref 43–77)
Platelets: 169 10*3/uL (ref 150–400)
RBC: 4.71 MIL/uL (ref 3.87–5.11)
RDW: 16.9 % — AB (ref 11.5–15.5)
WBC: 11.2 10*3/uL — AB (ref 4.0–10.5)

## 2013-10-14 LAB — COMPREHENSIVE METABOLIC PANEL
ALBUMIN: 3.9 g/dL (ref 3.5–5.2)
ALK PHOS: 68 U/L (ref 39–117)
ALT: 35 U/L (ref 0–35)
AST: 26 U/L (ref 0–37)
Anion gap: 14 (ref 5–15)
BUN: 10 mg/dL (ref 6–23)
CHLORIDE: 100 meq/L (ref 96–112)
CO2: 26 mEq/L (ref 19–32)
Calcium: 9.4 mg/dL (ref 8.4–10.5)
Creatinine, Ser: 0.94 mg/dL (ref 0.50–1.10)
GFR calc Af Amer: >90 mL/min (ref >90)
GFR calc non Af Amer: 83 mL/min — ABNORMAL LOW (ref >90)
Glucose, Bld: 94 mg/dL (ref 70–99)
POTASSIUM: 3.8 meq/L (ref 3.7–5.3)
SODIUM: 140 meq/L (ref 137–147)
Total Bilirubin: 0.7 mg/dL (ref 0.3–1.2)
Total Protein: 7.3 g/dL (ref 6.0–8.3)

## 2013-10-14 LAB — URINE MICROSCOPIC-ADD ON

## 2013-10-14 SURGERY — APPENDECTOMY, LAPAROSCOPIC
Anesthesia: General | Site: Abdomen

## 2013-10-14 MED ORDER — FENTANYL CITRATE 0.05 MG/ML IJ SOLN
INTRAMUSCULAR | Status: AC
  Filled 2013-10-14: qty 5

## 2013-10-14 MED ORDER — SODIUM CHLORIDE 0.9 % IV BOLUS (SEPSIS)
500.0000 mL | Freq: Once | INTRAVENOUS | Status: AC
Start: 2013-10-14 — End: 2013-10-14
  Administered 2013-10-14: 500 mL via INTRAVENOUS

## 2013-10-14 MED ORDER — MIDAZOLAM HCL 2 MG/2ML IJ SOLN
INTRAMUSCULAR | Status: AC
  Filled 2013-10-14: qty 2

## 2013-10-14 MED ORDER — FENTANYL CITRATE 0.05 MG/ML IJ SOLN
INTRAMUSCULAR | Status: DC | PRN
  Administered 2013-10-14 – 2013-10-15 (×3): 100 ug via INTRAVENOUS
  Administered 2013-10-15 (×2): 50 ug via INTRAVENOUS
  Administered 2013-10-15: 100 ug via INTRAVENOUS

## 2013-10-14 MED ORDER — HYDROMORPHONE HCL PF 1 MG/ML IJ SOLN
1.0000 mg | Freq: Once | INTRAMUSCULAR | Status: AC
  Administered 2013-10-14: 1 mg via INTRAVENOUS
  Filled 2013-10-14: qty 1

## 2013-10-14 MED ORDER — SUCCINYLCHOLINE CHLORIDE 20 MG/ML IJ SOLN
INTRAMUSCULAR | Status: DC | PRN
  Administered 2013-10-14: 50 mg via INTRAVENOUS

## 2013-10-14 MED ORDER — LACTATED RINGERS IV SOLN: INTRAVENOUS | Status: DC | PRN

## 2013-10-14 MED ORDER — STERILE WATER FOR INJECTION IJ SOLN
INTRAMUSCULAR | Status: AC
  Filled 2013-10-14: qty 10

## 2013-10-14 MED ORDER — IOHEXOL 300 MG/ML  SOLN
50.0000 mL | Freq: Once | INTRAMUSCULAR | Status: AC | PRN
Start: 2013-10-14 — End: 2013-10-14
  Administered 2013-10-14: 50 mL via ORAL

## 2013-10-14 MED ORDER — SODIUM CHLORIDE 0.9 % IV SOLN
3.0000 g | Freq: Once | INTRAVENOUS | Status: AC
  Administered 2013-10-14: 3 g via INTRAVENOUS
  Filled 2013-10-14 (×2): qty 3

## 2013-10-14 MED ORDER — IOHEXOL 300 MG/ML  SOLN
100.0000 mL | Freq: Once | INTRAMUSCULAR | Status: AC | PRN
Start: 2013-10-14 — End: 2013-10-14
  Administered 2013-10-14: 100 mL via INTRAVENOUS

## 2013-10-14 MED ORDER — BUPIVACAINE-EPINEPHRINE (PF) 0.25% -1:200000 IJ SOLN
INTRAMUSCULAR | Status: AC
  Filled 2013-10-14: qty 30

## 2013-10-14 MED ORDER — ONDANSETRON HCL 4 MG/2ML IJ SOLN
4.0000 mg | Freq: Once | INTRAMUSCULAR | Status: AC
  Administered 2013-10-14: 4 mg via INTRAVENOUS
  Filled 2013-10-14: qty 2

## 2013-10-14 MED ORDER — PROPOFOL 10 MG/ML IV BOLUS
INTRAVENOUS | Status: DC | PRN
  Administered 2013-10-14: 20 mg via INTRAVENOUS
  Administered 2013-10-14: 100 mg via INTRAVENOUS

## 2013-10-14 MED ORDER — LIDOCAINE HCL (CARDIAC) 20 MG/ML IV SOLN
INTRAVENOUS | Status: DC | PRN
  Administered 2013-10-14: 50 mg via INTRAVENOUS

## 2013-10-14 MED ORDER — ARTIFICIAL TEARS OP OINT
TOPICAL_OINTMENT | OPHTHALMIC | Status: AC
  Filled 2013-10-14: qty 3.5

## 2013-10-14 MED ORDER — PROPOFOL 10 MG/ML IV BOLUS
INTRAVENOUS | Status: AC
  Filled 2013-10-14: qty 20

## 2013-10-14 MED ORDER — SODIUM CHLORIDE 0.9 % IV SOLN
INTRAVENOUS | Status: DC
  Administered 2013-10-14 – 2013-10-15 (×3): via INTRAVENOUS

## 2013-10-14 MED ORDER — LIDOCAINE HCL (CARDIAC) 20 MG/ML IV SOLN
INTRAVENOUS | Status: AC
  Filled 2013-10-14: qty 10

## 2013-10-14 SURGICAL SUPPLY — 39 items
APPLIER CLIP ROT 10 11.4 M/L (STAPLE)
BLADE SURG ROTATE 9660 (MISCELLANEOUS) IMPLANT
CANISTER SUCTION 2500CC (MISCELLANEOUS) ×3 IMPLANT
CHLORAPREP W/TINT 26ML (MISCELLANEOUS) ×3 IMPLANT
CLIP APPLIE ROT 10 11.4 M/L (STAPLE) IMPLANT
COVER SURGICAL LIGHT HANDLE (MISCELLANEOUS) ×3 IMPLANT
CUTTER LINEAR ENDO 35 ETS (STAPLE) ×3 IMPLANT
CUTTER LINEAR ENDO 35 ETS TH (STAPLE) IMPLANT
DERMABOND ADVANCED (GAUZE/BANDAGES/DRESSINGS) ×2
DERMABOND ADVANCED .7 DNX12 (GAUZE/BANDAGES/DRESSINGS) ×1 IMPLANT
DRAPE UTILITY 15X26 W/TAPE STR (DRAPE) ×6 IMPLANT
ELECT REM PT RETURN 9FT ADLT (ELECTROSURGICAL) ×3
ELECTRODE REM PT RTRN 9FT ADLT (ELECTROSURGICAL) ×1 IMPLANT
ENDOLOOP SUT PDS II  0 18 (SUTURE)
ENDOLOOP SUT PDS II 0 18 (SUTURE) IMPLANT
GLOVE BIOGEL PI IND STRL 8 (GLOVE) ×1 IMPLANT
GLOVE BIOGEL PI INDICATOR 8 (GLOVE) ×2
GLOVE ECLIPSE 7.5 STRL STRAW (GLOVE) ×3 IMPLANT
GOWN STRL REUS W/ TWL LRG LVL3 (GOWN DISPOSABLE) ×3 IMPLANT
GOWN STRL REUS W/TWL LRG LVL3 (GOWN DISPOSABLE) ×6
KIT BASIN OR (CUSTOM PROCEDURE TRAY) ×3 IMPLANT
KIT ROOM TURNOVER OR (KITS) ×3 IMPLANT
NS IRRIG 1000ML POUR BTL (IV SOLUTION) ×3 IMPLANT
PAD ARMBOARD 7.5X6 YLW CONV (MISCELLANEOUS) ×6 IMPLANT
PENCIL BUTTON HOLSTER BLD 10FT (ELECTRODE) IMPLANT
POUCH SPECIMEN RETRIEVAL 10MM (ENDOMECHANICALS) ×3 IMPLANT
RELOAD /EVU35 (ENDOMECHANICALS) IMPLANT
RELOAD CUTTER ETS 35MM STAND (ENDOMECHANICALS) IMPLANT
SCALPEL HARMONIC ACE (MISCELLANEOUS) ×3 IMPLANT
SET IRRIG TUBING LAPAROSCOPIC (IRRIGATION / IRRIGATOR) ×3 IMPLANT
SPECIMEN JAR SMALL (MISCELLANEOUS) ×3 IMPLANT
SUT MNCRL AB 4-0 PS2 18 (SUTURE) ×3 IMPLANT
TOWEL OR 17X24 6PK STRL BLUE (TOWEL DISPOSABLE) ×3 IMPLANT
TOWEL OR 17X26 10 PK STRL BLUE (TOWEL DISPOSABLE) ×3 IMPLANT
TRAY FOLEY CATH 16FR SILVER (SET/KITS/TRAYS/PACK) ×3 IMPLANT
TRAY LAPAROSCOPIC (CUSTOM PROCEDURE TRAY) ×3 IMPLANT
TROCAR XCEL 12X100 BLDLESS (ENDOMECHANICALS) ×3 IMPLANT
TROCAR XCEL BLUNT TIP 100MML (ENDOMECHANICALS) ×3 IMPLANT
TROCAR XCEL NON-BLD 5MMX100MML (ENDOMECHANICALS) ×3 IMPLANT

## 2013-10-14 NOTE — ED Notes (Signed)
Dr Wyatt at bedside.  

## 2013-10-14 NOTE — ED Notes (Signed)
Pt is one month post-partum and today c/o umbilical pain, found to have acute appendicitis and sent here for further treatment, denies pain at this time. Pt alert and oriented upon arrival

## 2013-10-14 NOTE — ED Provider Notes (Signed)
CSN: [id number]     Arrival date & time 10/14/13  1317 History  This chart was scribed for Fredia Sorrow, MD by Ludger Nutting, ED Scribe. This patient was seen in room [facility] and the patient's care was started 3:36 PM.    Chief Complaint  Patient presents with  . Abdominal Pain    The history is provided by the patient. No language interpreter was used.   HPI Comments: Roniqua Kintz is a 28 y.o. female who presents to the Emergency Department complaining of constant, non-radiating RLQ and periumbilical abdominal pain that began yesterday. She describes the pain as sharp in the RLQ and a dull ache in the periumbilical region. She rates the pain as 5-6/10, 10/10 at worst. She states the pain is aggravated by walking. She denies history of similar symptoms.  Patient states she was pregnant and delivered on 06/12/80 without complications. She denies nausea, vomiting, diarrhea, dysuria.   Followed by [facility]   Past Medical History  Diagnosis Date  . Medical history non-contributory    Past Surgical History  Procedure Laterality Date  . No past surgeries     Family History  Problem Relation Age of Onset  . Diabetes Other   . Hypertension Other   . Stroke Other   . Cancer Other     lung, pancreatic  . Diverticulitis Mother    History  Substance Use Topics  . Smoking status: Never Smoker   . Smokeless tobacco: Never Used  . Alcohol Use: No   OB History   Grav Para Term Preterm Abortions TAB SAB Ect Mult Living   6 4 4  2  2   4      Review of Systems  Constitutional: Negative for fever and chills.  HENT: Negative for rhinorrhea and sore throat.   Eyes: Negative for visual disturbance.  Respiratory: Negative for cough and shortness of breath.   Cardiovascular: Negative for chest pain and leg swelling.  Gastrointestinal: Positive for abdominal pain. Negative for nausea, vomiting and diarrhea.  Genitourinary: Negative for dysuria.  Musculoskeletal: Negative for  back pain and neck pain.  Skin: Positive for rash.  Neurological: Positive for headaches.  Hematological: Does not bruise/bleed easily.  Psychiatric/Behavioral: Negative for confusion.      Allergies  Review of patient's allergies indicates no known allergies.  Home Medications   Prior to Admission medications   Medication Sig Start Date End Date Taking? Authorizing Provider  Prenatal Vit-Fe Fumarate-FA (PRENATAL MULTIVITAMIN) TABS tablet Take 1 tablet by mouth daily at 12 noon.   Yes Historical Provider, MD   BP 111/76  Pulse 106  Temp(Src) 99.3 F (37.4 C) (Oral)  Resp 18  Ht 5' (1.524 m)  Wt 119 lb 3.2 oz (54.069 kg)  BMI 23.28 kg/m2  SpO2 97%  Breastfeeding? No Physical Exam  Nursing note and vitals reviewed. Constitutional: She is oriented to person, place, and time. She appears well-developed and well-nourished.  HENT:  Head: Normocephalic and atraumatic.  Eyes: Conjunctivae and EOM are normal.  Neck: Normal range of motion.  Cardiovascular: Normal rate, regular rhythm and normal heart sounds.   Pulmonary/Chest: Effort normal and breath sounds normal. No respiratory distress. She has no wheezes. She has no rales.  Abdominal: Soft. Bowel sounds are normal. She exhibits no distension. There is tenderness (RUQ and RLQ). There is no rebound and no guarding.  Musculoskeletal: Normal range of motion. She exhibits no edema.  Neurological: She is alert and oriented to person, place, and time.  No cranial nerve deficit. Coordination normal.  Skin: Skin is warm and dry.  Psychiatric: She has a normal mood and affect.    ED Course  Procedures (including critical care time)  DIAGNOSTIC STUDIES: Oxygen Saturation is 100% on RA, normal by my interpretation.    COORDINATION OF CARE: 3:39 PM Discussed treatment plan with pt at bedside and pt agreed to plan.   Labs Review Labs Reviewed  CBC WITH DIFFERENTIAL - Abnormal; Notable for the following:    WBC 11.2 (*)    RDW  16.9 (*)    Neutrophils Relative % 78 (*)    Neutro Abs 8.7 (*)    All other components within normal limits  COMPREHENSIVE METABOLIC PANEL - Abnormal; Notable for the following:    GFR calc non Af Amer 83 (*)    All other components within normal limits  URINALYSIS, ROUTINE W REFLEX MICROSCOPIC - Abnormal; Notable for the following:    APPearance HAZY (*)    Hgb urine dipstick TRACE (*)    Leukocytes, UA SMALL (*)    All other components within normal limits  URINE MICROSCOPIC-ADD ON - Abnormal; Notable for the following:    Squamous Epithelial / LPF FEW (*)    Casts GRANULAR CAST (*)    All other components within normal limits  PREGNANCY, URINE   Results for orders placed during the hospital encounter of 10/14/13  CBC WITH DIFFERENTIAL      Result Value Ref Range   WBC 11.2 (*) 4.0 - 10.5 K/uL   RBC 4.71  3.87 - 5.11 MIL/uL   Hemoglobin 12.4  12.0 - 15.0 g/dL   HCT 39.3  36.0 - 46.0 %   MCV 83.4  78.0 - 100.0 fL   MCH 26.3  26.0 - 34.0 pg   MCHC 31.6  30.0 - 36.0 g/dL   RDW 16.9 (*) 11.5 - 15.5 %   Platelets 169  150 - 400 K/uL   Neutrophils Relative % 78 (*) 43 - 77 %   Neutro Abs 8.7 (*) 1.7 - 7.7 K/uL   Lymphocytes Relative 15  12 - 46 %   Lymphs Abs 1.7  0.7 - 4.0 K/uL   Monocytes Relative 7  3 - 12 %   Monocytes Absolute 0.8  0.1 - 1.0 K/uL   Eosinophils Relative 0  0 - 5 %   Eosinophils Absolute 0.0  0.0 - 0.7 K/uL   Basophils Relative 0  0 - 1 %   Basophils Absolute 0.0  0.0 - 0.1 K/uL  COMPREHENSIVE METABOLIC PANEL      Result Value Ref Range   Sodium 140  137 - 147 mEq/L   Potassium 3.8  3.7 - 5.3 mEq/L   Chloride 100  96 - 112 mEq/L   CO2 26  19 - 32 mEq/L   Glucose, Bld 94  70 - 99 mg/dL   BUN 10  6 - 23 mg/dL   Creatinine, Ser 0.94  0.50 - 1.10 mg/dL   Calcium 9.4  8.4 - 10.5 mg/dL   Total Protein 7.3  6.0 - 8.3 g/dL   Albumin 3.9  3.5 - 5.2 g/dL   AST 26  0 - 37 U/L   ALT 35  0 - 35 U/L   Alkaline Phosphatase 68  39 - 117 U/L   Total Bilirubin  0.7  0.3 - 1.2 mg/dL   GFR calc non Af Amer 83 (*) >90 mL/min   GFR calc Af Amer >90  >90  mL/min   Anion gap 14  5 - 15  URINALYSIS, ROUTINE W REFLEX MICROSCOPIC      Result Value Ref Range   Color, Urine YELLOW  YELLOW   APPearance HAZY (*) CLEAR   Specific Gravity, Urine 1.015  1.005 - 1.030   pH 6.5  5.0 - 8.0   Glucose, UA NEGATIVE  NEGATIVE mg/dL   Hgb urine dipstick TRACE (*) NEGATIVE   Bilirubin Urine NEGATIVE  NEGATIVE   Ketones, ur NEGATIVE  NEGATIVE mg/dL   Protein, ur NEGATIVE  NEGATIVE mg/dL   Urobilinogen, UA 0.2  0.0 - 1.0 mg/dL   Nitrite NEGATIVE  NEGATIVE   Leukocytes, UA SMALL (*) NEGATIVE  URINE MICROSCOPIC-ADD ON      Result Value Ref Range   Squamous Epithelial / LPF FEW (*) RARE   WBC, UA 0-2  <3 WBC/hpf   RBC / HPF 0-2  <3 RBC/hpf   Bacteria, UA RARE  RARE   Casts GRANULAR CAST (*) NEGATIVE  PREGNANCY, URINE      Result Value Ref Range   Preg Test, Ur NEGATIVE  NEGATIVE     Imaging Review Ct Abdomen Pelvis W Contrast  10/14/2013   CLINICAL DATA:  Right lower quadrant and periumbilical abdominal pain.  EXAM: CT ABDOMEN AND PELVIS WITH CONTRAST  TECHNIQUE: Multidetector CT imaging of the abdomen and pelvis was performed using the standard protocol following bolus administration of intravenous contrast.  CONTRAST:  83mL OMNIPAQUE IOHEXOL 300 MG/ML SOLN, 149mL OMNIPAQUE IOHEXOL 300 MG/ML SOLN  COMPARISON:  No priors.  FINDINGS: Lung Bases: Unremarkable.  Abdomen/Pelvis: The appendix is enlarged measuring up to 10 mm. A small appendicolith is noted in the distal aspect of the appendix. The appendiceal wall appears thickened and inflamed, and there is extensive periappendiceal fluid and stranding. No definite well-defined periappendiceal abscess is identified at this time. Additionally, no pneumoperitoneum is noted to suggest frank perforation. There is also trace volume of ascites in the low anatomic pelvis which is nonspecific (conceivably physiologic in this young  female patient). No larger volume of ascites is noted.  The appearance of the liver, gallbladder, pancreas, spleen, bilateral adrenal glands and bilateral kidneys is unremarkable. No pathologic distention of small bowel. No lymphadenopathy identified in the abdomen or pelvis. Uterus and ovaries are unremarkable in appearance.  Musculoskeletal: There are no aggressive appearing lytic or blastic lesions noted in the visualized portions of the skeleton.  IMPRESSION: 1. Findings are compatible with severe acute appendicitis. At this time, there is extensive periappendiceal stranding and some fluid, without a well-defined periappendiceal abscess. The possibility of perforated appendicitis is not excluded, however, no frank pneumoperitoneum is noted at this time. Emergent surgical consultation is strongly recommended. Critical Value/emergent results were called by telephone at the time of interpretation on 10/14/2013 at 6:28 pm to Dr. Fredia Sorrow , who verbally acknowledged these results.   Electronically Signed   By: Vinnie Langton M.D.   On: 10/14/2013 18:34     EKG Interpretation None      MDM   Final diagnoses:  Appendicitis, unspecified appendicitis type   CT scan consistent with appendicitis. Discussed with Dr. Brantley Stage covering for general surgery. Patient will be transferred to the emergency department at [facility]. Dr. Brantley Stage we'll also discuss with Dr. Hulen Skains down there.  I personally performed the services described in this documentation, which was scribed in my presence. The recorded information has been reviewed and is accurate.     Fredia Sorrow, MD 10/14/13 [phone]

## 2013-10-14 NOTE — ED Notes (Signed)
Unasyn requested from pharmacy 

## 2013-10-14 NOTE — ED Notes (Signed)
Patient c/o abd pain in navel region that radiates into right lower abd. Per patient started with no appetite. Denies any vomiting, diarrhea, or fevers. Patient reports pressure with urination and some nausea.

## 2013-10-14 NOTE — ED Notes (Signed)
Contacted pharmacy regarding delay in delivery of Unasyn

## 2013-10-14 NOTE — ED Provider Notes (Signed)
8:40 PM patient appears well. Abdomen is soft. Pain is controlled. Will have nursing notify surgery.  Pamella Pert, MD 10/14/13 2328

## 2013-10-14 NOTE — H&P (Signed)
Courtney Daniel is an 28 y.o. female.   Chief Complaint: Abdominal pain HPI: Started over 24 hours with epigastric pan which had migrated to the RLQ by yesterday which has persisted and worsened.  Nothing to eat since yesterday.  Went to [facility] ans was sent here for admission.  Past Medical History  Diagnosis Date  . Medical history non-contributory     Past Surgical History  Procedure Laterality Date  . No past surgeries      Family History  Problem Relation Age of Onset  . Diabetes Other   . Hypertension Other   . Stroke Other   . Cancer Other     lung, pancreatic  . Diverticulitis Mother    Social History:  reports that she has never smoked. She has never used smokeless tobacco. She reports that she does not drink alcohol or use illicit drugs.  Allergies: No Known Allergies   (Not in a hospital admission)  Results for orders placed during the hospital encounter of 10/14/13 (from the past 48 hour(s))  CBC WITH DIFFERENTIAL     Status: Abnormal   Collection Time    10/14/13  2:20 PM      Result Value Ref Range   WBC 11.2 (*) 4.0 - 10.5 K/uL   RBC 4.71  3.87 - 5.11 MIL/uL   Hemoglobin 12.4  12.0 - 15.0 g/dL   HCT 39.3  36.0 - 46.0 %   MCV 83.4  78.0 - 100.0 fL   MCH 26.3  26.0 - 34.0 pg   MCHC 31.6  30.0 - 36.0 g/dL   RDW 16.9 (*) 11.5 - 15.5 %   Platelets 169  150 - 400 K/uL   Neutrophils Relative % 78 (*) 43 - 77 %   Neutro Abs 8.7 (*) 1.7 - 7.7 K/uL   Lymphocytes Relative 15  12 - 46 %   Lymphs Abs 1.7  0.7 - 4.0 K/uL   Monocytes Relative 7  3 - 12 %   Monocytes Absolute 0.8  0.1 - 1.0 K/uL   Eosinophils Relative 0  0 - 5 %   Eosinophils Absolute 0.0  0.0 - 0.7 K/uL   Basophils Relative 0  0 - 1 %   Basophils Absolute 0.0  0.0 - 0.1 K/uL  COMPREHENSIVE METABOLIC PANEL     Status: Abnormal   Collection Time    10/14/13  2:20 PM      Result Value Ref Range   Sodium 140  137 - 147 mEq/L   Potassium 3.8  3.7 - 5.3 mEq/L   Chloride 100  96 - 112 mEq/L   CO2 26   19 - 32 mEq/L   Glucose, Bld 94  70 - 99 mg/dL   BUN 10  6 - 23 mg/dL   Creatinine, Ser 0.94  0.50 - 1.10 mg/dL   Calcium 9.4  8.4 - 10.5 mg/dL   Total Protein 7.3  6.0 - 8.3 g/dL   Albumin 3.9  3.5 - 5.2 g/dL   AST 26  0 - 37 U/L   ALT 35  0 - 35 U/L   Alkaline Phosphatase 68  39 - 117 U/L   Total Bilirubin 0.7  0.3 - 1.2 mg/dL   GFR calc non Af Amer 83 (*) >90 mL/min   GFR calc Af Amer >90  >90 mL/min   Comment: (NOTE)     The eGFR has been calculated using the CKD EPI equation.     This calculation has not been  validated in all clinical situations.     eGFR's persistently <90 mL/min signify possible Chronic Kidney     Disease.   Anion gap 14  5 - 15  URINALYSIS, ROUTINE W REFLEX MICROSCOPIC     Status: Abnormal   Collection Time    10/14/13  2:40 PM      Result Value Ref Range   Color, Urine YELLOW  YELLOW   APPearance HAZY (*) CLEAR   Specific Gravity, Urine 1.015  1.005 - 1.030   pH 6.5  5.0 - 8.0   Glucose, UA NEGATIVE  NEGATIVE mg/dL   Hgb urine dipstick TRACE (*) NEGATIVE   Bilirubin Urine NEGATIVE  NEGATIVE   Ketones, ur NEGATIVE  NEGATIVE mg/dL   Protein, ur NEGATIVE  NEGATIVE mg/dL   Urobilinogen, UA 0.2  0.0 - 1.0 mg/dL   Nitrite NEGATIVE  NEGATIVE   Leukocytes, UA SMALL (*) NEGATIVE  URINE MICROSCOPIC-ADD ON     Status: Abnormal   Collection Time    10/14/13  2:40 PM      Result Value Ref Range   Squamous Epithelial / LPF FEW (*) RARE   WBC, UA 0-2  <3 WBC/hpf   RBC / HPF 0-2  <3 RBC/hpf   Bacteria, UA RARE  RARE   Casts GRANULAR CAST (*) NEGATIVE  PREGNANCY, URINE     Status: None   Collection Time    10/14/13  2:40 PM      Result Value Ref Range   Preg Test, Ur NEGATIVE  NEGATIVE   Comment:            THE SENSITIVITY OF THIS     METHODOLOGY IS >20 mIU/mL.   Ct Abdomen Pelvis W Contrast  10/14/2013   CLINICAL DATA:  Right lower quadrant and periumbilical abdominal pain.  EXAM: CT ABDOMEN AND PELVIS WITH CONTRAST  TECHNIQUE: Multidetector CT imaging  of the abdomen and pelvis was performed using the standard protocol following bolus administration of intravenous contrast.  CONTRAST:  65m OMNIPAQUE IOHEXOL 300 MG/ML SOLN, 1081mOMNIPAQUE IOHEXOL 300 MG/ML SOLN  COMPARISON:  No priors.  FINDINGS: Lung Bases: Unremarkable.  Abdomen/Pelvis: The appendix is enlarged measuring up to 10 mm. A small appendicolith is noted in the distal aspect of the appendix. The appendiceal wall appears thickened and inflamed, and there is extensive periappendiceal fluid and stranding. No definite well-defined periappendiceal abscess is identified at this time. Additionally, no pneumoperitoneum is noted to suggest frank perforation. There is also trace volume of ascites in the low anatomic pelvis which is nonspecific (conceivably physiologic in this young female patient). No larger volume of ascites is noted.  The appearance of the liver, gallbladder, pancreas, spleen, bilateral adrenal glands and bilateral kidneys is unremarkable. No pathologic distention of small bowel. No lymphadenopathy identified in the abdomen or pelvis. Uterus and ovaries are unremarkable in appearance.  Musculoskeletal: There are no aggressive appearing lytic or blastic lesions noted in the visualized portions of the skeleton.  IMPRESSION: 1. Findings are compatible with severe acute appendicitis. At this time, there is extensive periappendiceal stranding and some fluid, without a well-defined periappendiceal abscess. The possibility of perforated appendicitis is not excluded, however, no frank pneumoperitoneum is noted at this time. Emergent surgical consultation is strongly recommended. Critical Value/emergent results were called by telephone at the time of interpretation on 10/14/2013 at 6:28 pm to Dr. SCFredia Sorrow who verbally acknowledged these results.   Electronically Signed   By: DaVinnie Langton.D.   On: 10/14/2013  18:34    Review of Systems  Constitutional: Negative for fever and chills.   Gastrointestinal: Positive for nausea, vomiting and abdominal pain.  All other systems reviewed and are negative.   Blood pressure 109/76, pulse 115, temperature 98.3 F (36.8 C), temperature source Oral, resp. rate 16, height 5' (1.524 m), weight 54.069 kg (119 lb 3.2 oz), SpO2 100.00%, not currently breastfeeding. Physical Exam  Constitutional: She is oriented to person, place, and time. She appears well-developed and well-nourished.  HENT:  Head: Normocephalic and atraumatic.  Eyes: Conjunctivae and EOM are normal. Pupils are equal, round, and reactive to light.  Neck: Normal range of motion. Neck supple.  Cardiovascular: Normal rate, regular rhythm and normal heart sounds.   Respiratory: Effort normal and breath sounds normal.  GI: Soft. Normal appearance and bowel sounds are normal. There is tenderness in the right lower quadrant. There is rebound and guarding.  Musculoskeletal: Normal range of motion.  Neurological: She is alert and oriented to person, place, and time. She has normal reflexes.  Skin: Skin is warm and dry.  Psychiatric: She has a normal mood and affect. Her behavior is normal. Judgment and thought content normal.     Assessment/Plan Acute appendicitis without apparent rupture according to CT  Unasyn preop  To the OR ASAP  Gianlucca Szymborski, Kathryne Eriksson 10/14/2013, 9:33 PM

## 2013-10-14 NOTE — Anesthesia Preprocedure Evaluation (Signed)
Anesthesia Evaluation  Patient identified by MRN, date of birth, ID band Patient awake    Reviewed: Allergy & Precautions, H&P , NPO status , Patient's Chart, lab work & pertinent test results  History of Anesthesia Complications Negative for: history of anesthetic complications  Airway Mallampati: II TM Distance: >3 FB Neck ROM: Full    Dental  (+) Teeth Intact   Pulmonary neg pulmonary ROS,  breath sounds clear to auscultation        Cardiovascular negative cardio ROS  Rhythm:Regular     Neuro/Psych negative neurological ROS     GI/Hepatic Neg liver ROS, appendicitis   Endo/Other    Renal/GU negative Renal ROS     Musculoskeletal   Abdominal   Peds  Hematology negative hematology ROS (+)   Anesthesia Other Findings   Reproductive/Obstetrics                           Anesthesia Physical Anesthesia Plan  ASA: I  Anesthesia Plan: General   Post-op Pain Management:    Induction: Intravenous, Rapid sequence and Cricoid pressure planned  Airway Management Planned: Oral ETT  Additional Equipment: None  Intra-op Plan:   Post-operative Plan: Extubation in OR  Informed Consent: I have reviewed the patients History and Physical, chart, labs and discussed the procedure including the risks, benefits and alternatives for the proposed anesthesia with the patient or authorized representative who has indicated his/her understanding and acceptance.   Dental advisory given  Plan Discussed with: CRNA and Surgeon  Anesthesia Plan Comments:         Anesthesia Quick Evaluation

## 2013-10-15 ENCOUNTER — Encounter (HOSPITAL_COMMUNITY): Payer: Self-pay | Admitting: *Deleted

## 2013-10-15 DIAGNOSIS — K3580 Unspecified acute appendicitis: Secondary | ICD-10-CM | POA: Diagnosis present

## 2013-10-15 MED ORDER — 0.9 % SODIUM CHLORIDE (POUR BTL) OPTIME
TOPICAL | Status: DC | PRN
  Administered 2013-10-15: 1000 mL

## 2013-10-15 MED ORDER — DEXAMETHASONE SODIUM PHOSPHATE 4 MG/ML IJ SOLN
INTRAMUSCULAR | Status: AC
  Filled 2013-10-15: qty 2

## 2013-10-15 MED ORDER — DEXAMETHASONE SODIUM PHOSPHATE 4 MG/ML IJ SOLN
INTRAMUSCULAR | Status: DC | PRN
  Administered 2013-10-15: 8 mg via INTRAVENOUS

## 2013-10-15 MED ORDER — ONDANSETRON HCL 4 MG/2ML IJ SOLN
INTRAMUSCULAR | Status: AC
  Filled 2013-10-15: qty 2

## 2013-10-15 MED ORDER — ACETAMINOPHEN 160 MG/5ML PO SOLN: 325.0000 mg | ORAL | Status: DC | PRN

## 2013-10-15 MED ORDER — GLYCOPYRROLATE 0.2 MG/ML IJ SOLN
INTRAMUSCULAR | Status: AC
  Filled 2013-10-15: qty 3

## 2013-10-15 MED ORDER — OXYCODONE-ACETAMINOPHEN 5-325 MG PO TABS: 1.0000 | ORAL_TABLET | ORAL | Status: DC | PRN

## 2013-10-15 MED ORDER — ROCURONIUM BROMIDE 100 MG/10ML IV SOLN
INTRAVENOUS | Status: DC | PRN
  Administered 2013-10-14: 30 mg via INTRAVENOUS

## 2013-10-15 MED ORDER — ARTIFICIAL TEARS OP OINT
TOPICAL_OINTMENT | OPHTHALMIC | Status: DC | PRN
  Administered 2013-10-14: 1 via OPHTHALMIC

## 2013-10-15 MED ORDER — BUPIVACAINE-EPINEPHRINE 0.25% -1:200000 IJ SOLN
INTRAMUSCULAR | Status: DC | PRN
Start: 2013-10-15 — End: 2013-10-15
  Administered 2013-10-15: 10 mL

## 2013-10-15 MED ORDER — SODIUM CHLORIDE 0.9 % IV SOLN
3.0000 g | Freq: Four times a day (QID) | INTRAVENOUS | Status: AC
  Administered 2013-10-15: 3 g via INTRAVENOUS
  Filled 2013-10-15: qty 3

## 2013-10-15 MED ORDER — HYDROMORPHONE HCL PF 1 MG/ML IJ SOLN
INTRAMUSCULAR | Status: AC
  Filled 2013-10-15: qty 1

## 2013-10-15 MED ORDER — HYDROMORPHONE HCL PF 1 MG/ML IJ SOLN
0.2500 mg | INTRAMUSCULAR | Status: DC | PRN
  Administered 2013-10-15: 0.5 mg via INTRAVENOUS

## 2013-10-15 MED ORDER — KETOROLAC TROMETHAMINE 30 MG/ML IJ SOLN: 15.0000 mg | Freq: Once | INTRAMUSCULAR | Status: DC | PRN

## 2013-10-15 MED ORDER — MIDAZOLAM HCL 5 MG/5ML IJ SOLN
INTRAMUSCULAR | Status: DC | PRN
  Administered 2013-10-14: 2 mg via INTRAVENOUS

## 2013-10-15 MED ORDER — ACETAMINOPHEN 325 MG PO TABS: 650.0000 mg | ORAL_TABLET | ORAL | Status: DC | PRN

## 2013-10-15 MED ORDER — ONDANSETRON HCL 4 MG PO TABS: 4.0000 mg | ORAL_TABLET | Freq: Four times a day (QID) | ORAL | Status: DC | PRN

## 2013-10-15 MED ORDER — NEOSTIGMINE METHYLSULFATE 10 MG/10ML IV SOLN
INTRAVENOUS | Status: AC
  Filled 2013-10-15: qty 2

## 2013-10-15 MED ORDER — ONDANSETRON HCL 4 MG/2ML IJ SOLN
INTRAMUSCULAR | Status: DC | PRN
  Administered 2013-10-14: 4 mg via INTRAVENOUS

## 2013-10-15 MED ORDER — SODIUM CHLORIDE 0.9 % IR SOLN
Status: DC | PRN
  Administered 2013-10-15: 1000 mL

## 2013-10-15 MED ORDER — NEOSTIGMINE METHYLSULFATE 10 MG/10ML IV SOLN
INTRAVENOUS | Status: DC | PRN
  Administered 2013-10-15: 5 mg via INTRAVENOUS

## 2013-10-15 MED ORDER — ONDANSETRON HCL 4 MG/2ML IJ SOLN: 4.0000 mg | Freq: Four times a day (QID) | INTRAMUSCULAR | Status: DC | PRN

## 2013-10-15 MED ORDER — PRENATAL MULTIVITAMIN CH
1.0000 | ORAL_TABLET | Freq: Every day | ORAL | Status: DC
  Administered 2013-10-15: 1 via ORAL
  Filled 2013-10-15: qty 1

## 2013-10-15 MED ORDER — ENOXAPARIN SODIUM 40 MG/0.4ML ~~LOC~~ SOLN: 40.0000 mg | SUBCUTANEOUS | Status: DC

## 2013-10-15 MED ORDER — OXYCODONE HCL 5 MG/5ML PO SOLN: 5.0000 mg | Freq: Once | ORAL | Status: DC | PRN

## 2013-10-15 MED ORDER — HYDROMORPHONE HCL PF 1 MG/ML IJ SOLN: 0.5000 mg | INTRAMUSCULAR | Status: DC | PRN

## 2013-10-15 MED ORDER — ACETAMINOPHEN 325 MG PO TABS: 325.0000 mg | ORAL_TABLET | ORAL | Status: DC | PRN

## 2013-10-15 MED ORDER — OXYCODONE HCL 5 MG PO TABS: 5.0000 mg | ORAL_TABLET | Freq: Once | ORAL | Status: DC | PRN

## 2013-10-15 MED ORDER — GLYCOPYRROLATE 0.2 MG/ML IJ SOLN
INTRAMUSCULAR | Status: DC | PRN
  Administered 2013-10-15: 0.6 mg via INTRAVENOUS

## 2013-10-15 MED ORDER — HYDROCODONE-ACETAMINOPHEN 5-325 MG PO TABS: 1.0000 | ORAL_TABLET | Freq: Four times a day (QID) | ORAL | Status: DC | PRN

## 2013-10-15 MED ORDER — LACTATED RINGERS IV SOLN
INTRAVENOUS | Status: DC | PRN
  Administered 2013-10-15: 01:00:00 via INTRAVENOUS

## 2013-10-15 NOTE — Transfer of Care (Signed)
Immediate Anesthesia Transfer of Care Note  Patient: Courtney Daniel  Procedure(s) Performed: Procedure(s): APPENDECTOMY LAPAROSCOPIC (N/A)  Patient Location: PACU  Anesthesia Type:General  Level of Consciousness: oriented, sedated, patient cooperative and responds to stimulation  Airway & Oxygen Therapy: Patient Spontanous Breathing  Post-op Assessment: Report given to PACU RN, Post -op Vital signs reviewed and stable, Patient moving all extremities and Patient moving all extremities X 4  Post vital signs: Reviewed and stable  Complications: No apparent anesthesia complications

## 2013-10-15 NOTE — Progress Notes (Signed)
Discussed discharge summary with patient. Reviewed medications with patient. Patient received Rx. Patient ready for discharge and waiting on ride.

## 2013-10-15 NOTE — Op Note (Signed)
OPERATIVE REPORT  DATE OF OPERATION: 10/14/2013   PATIENT:  Courtney Daniel  28 y.o. female  PRE-OPERATIVE DIAGNOSIS:  Acute appendicitis  POST-OPERATIVE DIAGNOSIS:  acute appendicitis  PROCEDURE:  Procedure(s): APPENDECTOMY LAPAROSCOPIC  SURGEON:  Surgeon(s): Gwenyth Ober, MD  ASSISTANT: None  ANESTHESIA:   general  EBL: <20 ml  BLOOD ADMINISTERED: none  DRAINS: none   SPECIMEN:  Source of Specimen:  Appendix  COUNTS CORRECT:  YES  PROCEDURE DETAILS: The patient was taken to the operating room and placed on the table in the supine position. After an adequate general endotracheal anesthetic was administered she was prepped and draped in the usual sterile manner exposing the midline of the abdomen.  The patient's entire abdomen was included in the draped area. A proper timeout was performed identifying the patient and the procedure to be performed. An infraumbilical midline incision was made using a #15 blade and taken down to the midline fascia. The fascia was grabbed with Kocher clamps and then we incised between the Kocher clamps in order to get into the preperitoneal space. We bluntly dissected down into the peritoneal cavity and then placed a pursestring suture of 0 Vicryl around the fascial opening. This secured in place a Hassan cannula which was subsequently used to insufflate the peritoneal cavity with carbon dioxide gas up to a maximal intra-abdominal pressure of 15 mm mercury.  The patient was placed in Trendelenburg position and the left eye was tilted down. A right upper quadrant 5 mm cannula and a left low quadrant 5 mm cannula was passed under direct vision.  It was noted though some matted inflammatory process in the right colon area. It was subsequently found was that the appendix is retrocecal and superlative in the right low quadrant. Retroperitoneum attachmentsWere taken down using the harmonic scalpel and electrocautery. This allowed [place] to rotate the cecum  medially. He was eventually able to dissect away the appendix from the base of the cecum taking the mesoappendix using a Harmonic Scalpel. We skinny the appendix down to its direct attachment to the cecum. We subsequently came across the base using a blue cartridge Endo GIA. This completely detached appendix from the cecum.  The appendix was retrieved from the infraumbilical cannula site. This was done using an Endo Catch bag. Once this was done we reinserted the cannulas and the laparoscope and inspected the area of dissection. There was minimal to no bleeding. Does a small amount of purulent fluid in the pelvis upon entering the abdomen. This did not appear to be pus but just reactive material from the acute appendicitis.  The appendix was completely removed and we irrigated with saline solution.  The infraumbilical fascial site was closed after removing the cannula using the pursestring suture which was in place. We aspirated all fluid and gas and removed all cannulas. 0.25% Marcaine with epinephrine was injected at all sites. The infraumbilical skin site was closed using running subcuticular stitch of 4-0 Monocryl. Dermabond Steri-Strips and Tegaderm she used to complete our dressings. All needle counts, sponge counts, and instrument counts were correct.     PATIENT DISPOSITION:  PACU - hemodynamically stable.   Elvy Mclarty O 8/10/20151:10 AM

## 2013-10-15 NOTE — Discharge Summary (Signed)
  Patient ID: Courtney Daniel [id number] 28 y.o. Sep 17, 1985  Admit date: 10/14/2013  Discharge date and time: No discharge date for patient encounter.  Admitting Physician: Judeth Horn, MD  Discharge Physician: Adin Hector  Admission Diagnoses: Appendicitis, unspecified appendicitis type [541]  Discharge Diagnoses: same  Operations: Procedure(s): APPENDECTOMY LAPAROSCOPIC  Admission Condition: fair  Discharged Condition: good  Indication for Admission: This is a 28 year old Caucasian female who is one month postpartum. She presented with 24-hour history of centralized abdominal pain which subsequently localized to the right lower quadrant. Anorexic. Went to [facility] and [facility].Examination revealed localized tenderness with rebound and guarding in the right lower quadrant.WBC 11,200. Urinalysis unremarkable. CT scan consistent with acute appendicitis with periappendiceal stranding and a little but of fluid but no abscess. No free air.  Hospital Course: The patient was evaluated and admitted by Dr. Jeneen Rinks wide. She was started on IV fluids IV antibiotics. She was taken the operating room and underwent laparoscopic appendectomy. On postop day 1 she felt much better. Pain was less. No nausea. Tolerating clear liquids. Abdomen was soft with minimal tenderness. No distention. Wound looked clean. She wanted to go home. Plan at this hours to advance her diet, and belated hall, and discharge home later this morning if everything goes well. She'll be given a prescription for Norco for pain. Diet and activities were discussed. Follow up Dr. Hulen Skains in 2 weeks.  Consults: None  Significant Diagnostic Studies: CT scan, surgical pathology (pending)  Treatments: surgery: Laparoscopic appendectomy  Disposition: Home  Patient Instructions:    Medication List         HYDROcodone-acetaminophen 5-325 MG per tablet  Commonly known as:  NORCO  Take 1-2 tablets by mouth every  6 (six) hours as needed.     prenatal multivitamin Tabs tablet  Take 1 tablet by mouth daily at 12 noon.        Activity: activity as tolerated Diet: regular diet Wound Care: none needed  Follow-up:  With Dr. Hulen Skains in 2 weeks.  Signed: Edsel Petrin. Dalbert Batman, M.D., FACS General and minimally invasive surgery Breast and Colorectal Surgery  10/15/2013, 7:49 AM

## 2013-10-15 NOTE — Anesthesia Procedure Notes (Signed)
Procedure Name: Intubation Date/Time: 10/14/2013 11:53 PM Performed by: Jacquiline Doe A Pre-anesthesia Checklist: Patient identified, Timeout performed, Emergency Drugs available, Suction available and Patient being monitored Patient Re-evaluated:Patient Re-evaluated prior to inductionOxygen Delivery Method: Circle system utilized Preoxygenation: Pre-oxygenation with 100% oxygen Intubation Type: IV induction, Rapid sequence and Cricoid Pressure applied Laryngoscope Size: Miller and 2 Grade View: Grade I Tube type: Oral Tube size: 7.5 mm Number of attempts: 1 Airway Equipment and Method: Stylet Placement Confirmation: ETT inserted through vocal cords under direct vision,  breath sounds checked- equal and bilateral and positive ETCO2 Secured at: 20 cm Tube secured with: Tape Dental Injury: Teeth and Oropharynx as per pre-operative assessment

## 2013-10-15 NOTE — Anesthesia Postprocedure Evaluation (Signed)
  Anesthesia Post-op Note  Patient: Courtney Daniel  Procedure(s) Performed: Procedure(s): APPENDECTOMY LAPAROSCOPIC (N/A)  Patient Location: PACU  Anesthesia Type:General  Level of Consciousness: alert , oriented, sedated, patient cooperative and responds to stimulation  Airway and Oxygen Therapy: Patient Spontanous Breathing  Post-op Pain: mild  Post-op Assessment: Post-op Vital signs reviewed, Patient's Cardiovascular Status Stable, Respiratory Function Stable, Patent Airway, No signs of Nausea or vomiting and Pain level controlled  Post-op Vital Signs: Reviewed and stable  Last Vitals:  Filed Vitals:   10/15/13 0130  BP: 106/81  Pulse: 108  Temp:   Resp: 18    Complications: No apparent anesthesia complications

## 2013-10-25 ENCOUNTER — Ambulatory Visit: Payer: BC Managed Care – PPO | Admitting: Advanced Practice Midwife

## 2014-01-07 ENCOUNTER — Encounter (HOSPITAL_COMMUNITY): Payer: Self-pay | Admitting: *Deleted

## 2015-11-13 IMAGING — CT CT ABD-PELV W/ CM
2 of 4 series · 15 of 46 positions shown, 17 images · IV contrast (Omnipaque 300)
Comparison: No priors.

CLINICAL DATA: Right lower quadrant and periumbilical abdominal
pain.

EXAM:
CT ABDOMEN AND PELVIS WITH CONTRAST
TECHNIQUE: Multidetector CT imaging of the abdomen and pelvis was performed
using the standard protocol following bolus administration of
intravenous contrast.
CONTRAST:  50mL OMNIPAQUE IOHEXOL 300 MG/ML SOLN, 100mL OMNIPAQUE
IOHEXOL 300 MG/ML SOLN

[Series 2: abd_pel_with 5.0 b40f · axial · 0.66mm/px · z∈[-443,-53]mm · 12 of 86 slices shown, 14 images]
[im 4/86  soft-tissue]
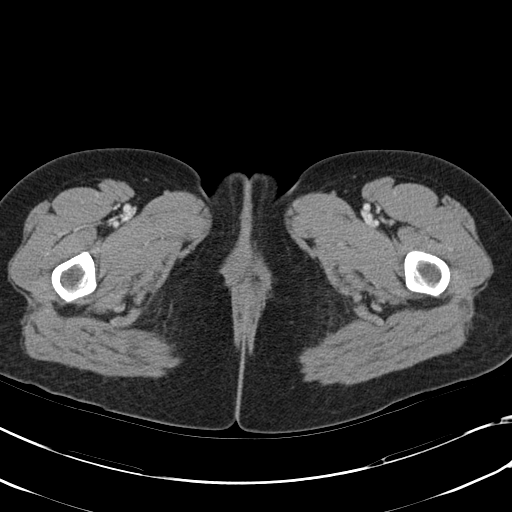
[im 4/86  bone]
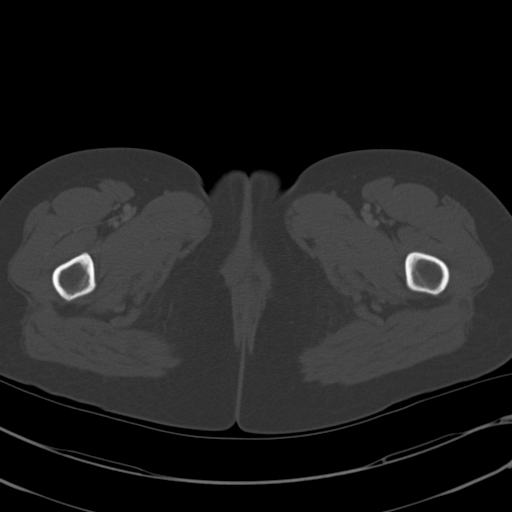
[im 11/86  soft-tissue]
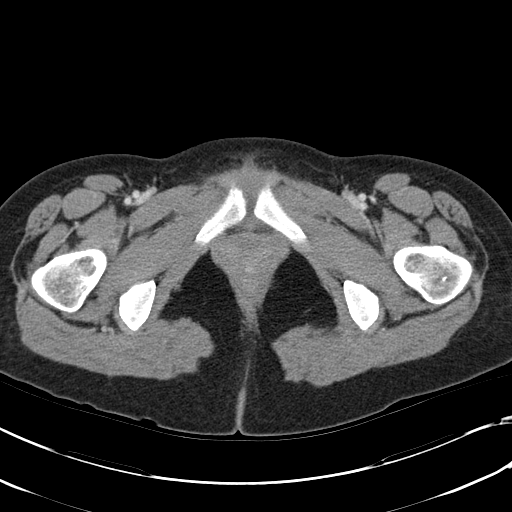
[im 18/86  soft-tissue]
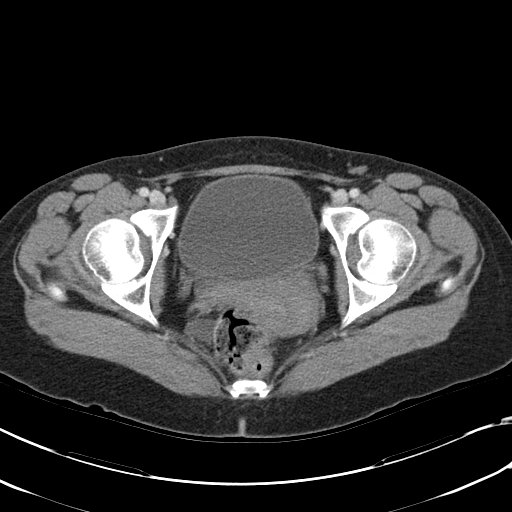
[im 25/86  soft-tissue]
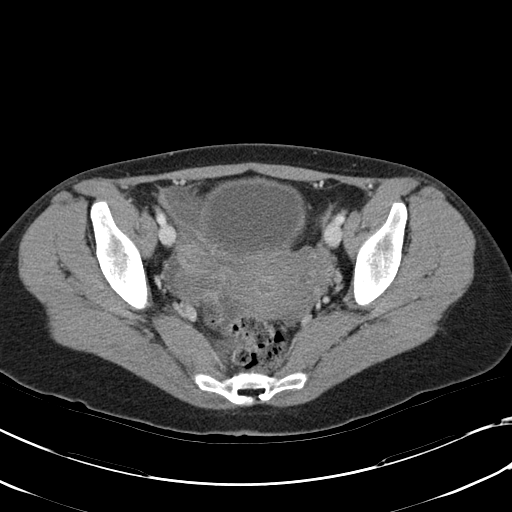
[im 32/86  soft-tissue]
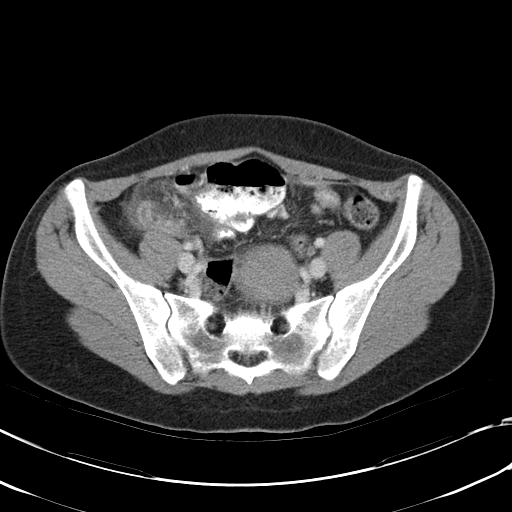
[im 39/86  soft-tissue]
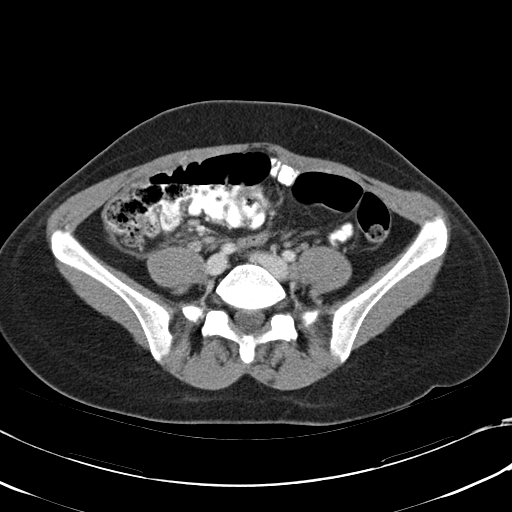
[im 47/86  soft-tissue]
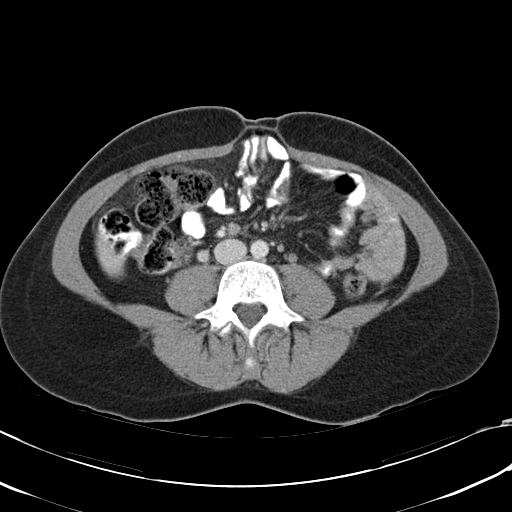
[im 54/86  soft-tissue]
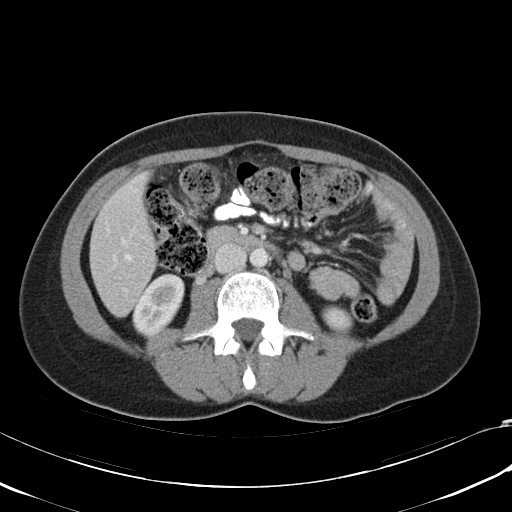
[im 61/86  soft-tissue]
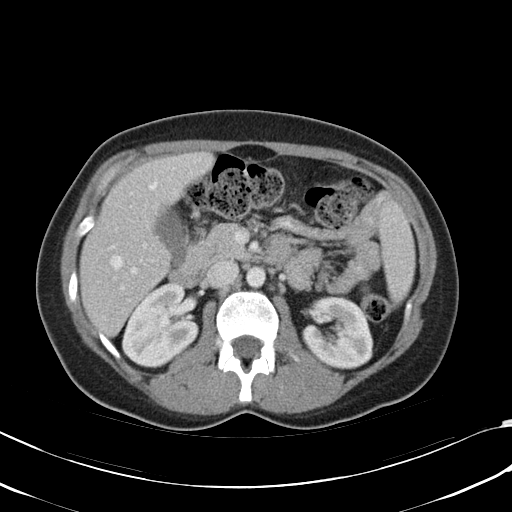
[im 61/86  bone]
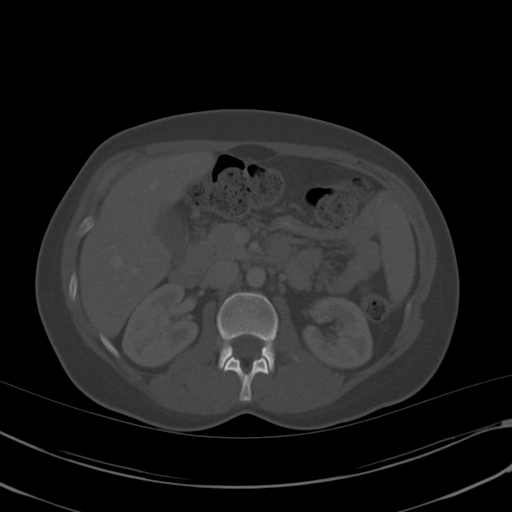
[im 68/86  soft-tissue]
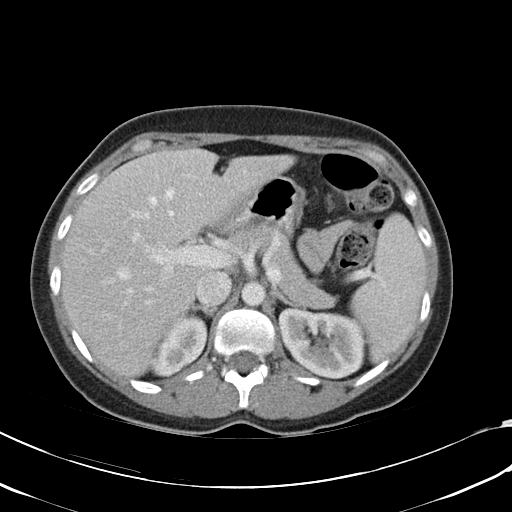
[im 75/86  soft-tissue]
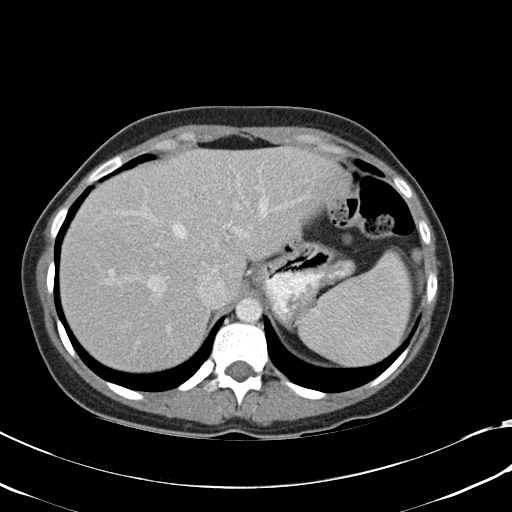
[im 82/86  soft-tissue]
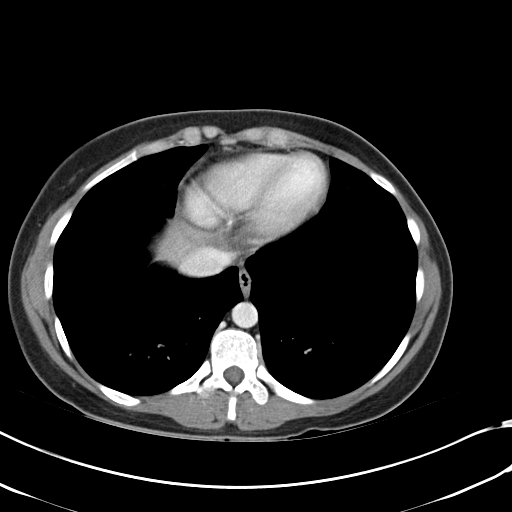

[Series 3: abd_pel_with 3.0 spo cor · coronal · 0.59mm/px · 3 of 71 slices shown]
[im 24/71  soft-tissue]
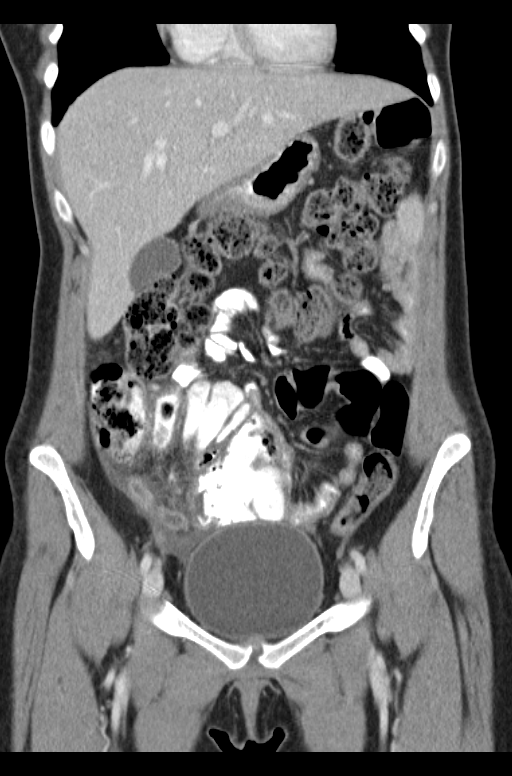
[im 32/71  soft-tissue]
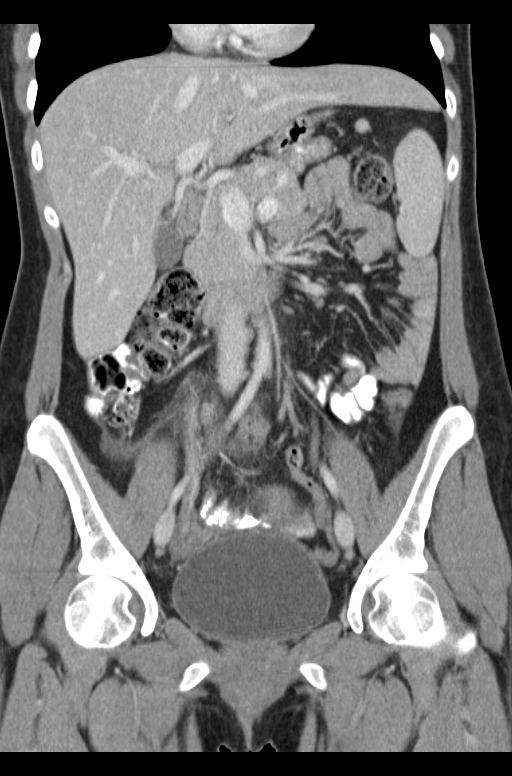
[im 39/71  soft-tissue]
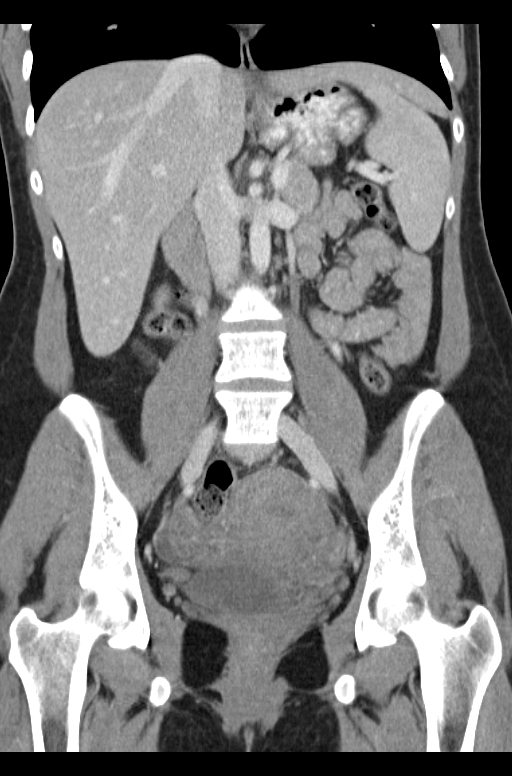

[15 of 46 positions shown; findings below may reference images not displayed]

FINDINGS: Lung Bases: Unremarkable.

Abdomen/Pelvis: The appendix is enlarged measuring up to 10 mm. A
small appendicolith is noted in the distal aspect of the appendix.
The appendiceal wall appears thickened and inflamed, and there is
extensive periappendiceal fluid and stranding. No definite
well-defined periappendiceal abscess is identified at this time.
Additionally, no pneumoperitoneum is noted to suggest frank
perforation. There is also trace volume of ascites in the low
anatomic pelvis which is nonspecific (conceivably physiologic in
this young female patient). No larger volume of ascites is noted.

The appearance of the liver, gallbladder, pancreas, spleen,
bilateral adrenal glands and bilateral kidneys is unremarkable. No
pathologic distention of small bowel. No lymphadenopathy identified
in the abdomen or pelvis. Uterus and ovaries are unremarkable in
appearance.

Musculoskeletal: There are no aggressive appearing lytic or blastic
lesions noted in the visualized portions of the skeleton.
IMPRESSION: 1. Findings are compatible with severe acute appendicitis. At this
time, there is extensive periappendiceal stranding and some fluid,
without a well-defined periappendiceal abscess. The possibility of
perforated appendicitis is not excluded, however, no frank
pneumoperitoneum is noted at this time. Emergent surgical
consultation is strongly recommended.
Critical Value/emergent results were called by telephone at the time
of interpretation on 10/14/2013 at [DATE] to Dr. KAMSEL DULA ,
who verbally acknowledged these results.

## 2019-02-09 DIAGNOSIS — Z302 Encounter for sterilization: Secondary | ICD-10-CM | POA: Diagnosis not present

## 2019-02-09 DIAGNOSIS — O34219 Maternal care for unspecified type scar from previous cesarean delivery: Secondary | ICD-10-CM | POA: Diagnosis not present

## 2019-12-12 ENCOUNTER — Ambulatory Visit: Payer: Self-pay | Admitting: Family Medicine

## 2019-12-25 ENCOUNTER — Encounter: Payer: Self-pay | Admitting: Family Medicine

## 2019-12-25 ENCOUNTER — Ambulatory Visit (INDEPENDENT_AMBULATORY_CARE_PROVIDER_SITE_OTHER): Payer: 59 | Admitting: Family Medicine

## 2019-12-25 ENCOUNTER — Other Ambulatory Visit: Payer: Self-pay

## 2019-12-25 VITALS — BP 121/79 | HR 93 | Temp 97.5°F | Resp 20 | Ht 60.0 in | Wt 122.5 lb

## 2019-12-25 DIAGNOSIS — D170 Benign lipomatous neoplasm of skin and subcutaneous tissue of head, face and neck: Secondary | ICD-10-CM

## 2019-12-25 DIAGNOSIS — Z0000 Encounter for general adult medical examination without abnormal findings: Secondary | ICD-10-CM

## 2019-12-25 LAB — CBC WITH DIFFERENTIAL/PLATELET
Basophils Absolute: 0 10*3/uL (ref 0.0–0.2)
Basos: 0 %
EOS (ABSOLUTE): 0.1 10*3/uL (ref 0.0–0.4)
Eos: 1 %
Hematocrit: 43 % (ref 34.0–46.6)
Hemoglobin: 13.9 g/dL (ref 11.1–15.9)
Immature Grans (Abs): 0 10*3/uL (ref 0.0–0.1)
Immature Granulocytes: 0 %
Lymphocytes Absolute: 2.3 10*3/uL (ref 0.7–3.1)
Lymphs: 35 %
MCH: 29.4 pg (ref 26.6–33.0)
MCHC: 32.3 g/dL (ref 31.5–35.7)
MCV: 91 fL (ref 79–97)
Monocytes Absolute: 0.7 10*3/uL (ref 0.1–0.9)
Monocytes: 10 %
Neutrophils Absolute: 3.6 10*3/uL (ref 1.4–7.0)
Neutrophils: 54 %
Platelets: 213 10*3/uL (ref 150–450)
RBC: 4.73 x10E6/uL (ref 3.77–5.28)
RDW: 12.2 % (ref 11.7–15.4)
WBC: 6.6 10*3/uL (ref 3.4–10.8)

## 2019-12-25 NOTE — Patient Instructions (Signed)
Lipoma  A lipoma is a noncancerous (benign) tumor that is made up of fat cells. This is a very common type of soft-tissue growth. Lipomas are usually found under the skin (subcutaneous). They may occur in any tissue of the body that contains fat. Common areas for lipomas to appear include the back, arms, shoulders, buttocks, and thighs. Lipomas grow slowly, and they are usually painless. Most lipomas do not cause problems and do not require treatment. What are the causes? The cause of this condition is not known. What increases the risk? You are more likely to develop this condition if:  You are 40-60 years old.  You have a family history of lipomas. What are the signs or symptoms? A lipoma usually appears as a small, round bump under the skin. In most cases, the lump will:  Feel soft or rubbery.  Not cause pain or other symptoms. However, if a lipoma is located in an area where it pushes on nerves, it can become painful or cause other symptoms. How is this diagnosed? A lipoma can usually be diagnosed with a physical exam. You may also have tests to confirm the diagnosis and to rule out other conditions. Tests may include:  Imaging tests, such as a CT scan or an MRI.  Removal of a tissue sample to be looked at under a microscope (biopsy). How is this treated? Treatment for this condition depends on the size of the lipoma and whether it is causing any symptoms.  For small lipomas that are not causing problems, no treatment is needed.  If a lipoma is bigger or it causes problems, surgery may be done to remove the lipoma. Lipomas can also be removed to improve appearance. Most often, the procedure is done after applying a medicine that numbs the area (local anesthetic).  Liposuction may be done to reduce the size of the lipoma before it is removed through surgery, or it may be done to remove the lipoma. Lipomas are removed with this method in order to limit incision size and scarring. A  liposuction tube is inserted through a small incision into the lipoma, and the contents of the lipoma are removed through the tube with suction. Follow these instructions at home:  Watch your lipoma for any changes.  Keep all follow-up visits as told by your health care provider. This is important. Contact a health care provider if:  Your lipoma becomes larger or hard.  Your lipoma becomes painful, red, or increasingly swollen. These could be signs of infection or a more serious condition. Get help right away if:  You develop tingling or numbness in an area near the lipoma. This could indicate that the lipoma is causing nerve damage. Summary  A lipoma is a noncancerous tumor that is made up of fat cells.  Most lipomas do not cause problems and do not require treatment.  If a lipoma is bigger or it causes problems, surgery may be done to remove the lipoma.  Contact a health care provider if your lipoma becomes larger or hard, or if it becomes painful, red, or increasingly swollen. Pain, redness, and swelling could be signs of infection or a more serious condition. This information is not intended to replace advice given to you by your health care provider. Make sure you discuss any questions you have with your health care provider. Document Revised: 10/09/2018 Document Reviewed: 10/09/2018 Elsevier Patient Education  2020 [employer]  

## 2019-12-25 NOTE — Progress Notes (Signed)
New Patient Office Visit  Subjective:  Patient ID: Courtney Daniel, female    DOB: Nov 01, 1985  Age: 34 y.o. MRN: [id number]  CC:  Chief Complaint  Patient presents with  . Establish Care  . Adenopathy    behind left ear    HPI Courtney Daniel presents to establish care. She denies any personal medication history. She follows up with her GYN for paps and breast exam. Her las pap and breast exam was in January of this year. She is not fasting this morning. She has the following concerns today:   1. Swollen area behind left ear Courtney Daniel reports a swollen area behind her left ear that has been present for a couple years. It has slowly gotten larger. As it has gotten larger, it has gotten a little red. She denies pain, fever, tenderness, night sweats, unintended weight loss, or fatigue.    Past Medical History:  Diagnosis Date  . Medical history non-contributory     Past Surgical History:  Procedure Laterality Date  . APPENDECTOMY  10/14/2013  . CESAREAN SECTION  09/16/2016 & 02/09/2019  . LAPAROSCOPIC APPENDECTOMY N/A 10/14/2013   Procedure: APPENDECTOMY LAPAROSCOPIC;  Surgeon: Gwenyth Ober, MD;  Location: [facility];  Service: General;  Laterality: N/A;  . NO PAST SURGERIES      Family History  Problem Relation Age of Onset  . Diabetes Other   . Hypertension Other   . Stroke Other   . Cancer Other        lung, pancreatic  . Diverticulitis Mother   . Hypertension Father   . Heart disease Father   . Cancer Maternal Grandmother        pancreatic  . COPD Maternal Grandfather   . Heart disease Paternal Grandfather     Social History   Socioeconomic History  . Marital status: Unknown    Spouse name: Not on file  . Number of children: Not on file  . Years of education: Not on file  . Highest education level: Not on file  Occupational History  . Not on file  Tobacco Use  . Smoking status: Never Smoker  . Smokeless tobacco: Never Used  Substance and Sexual Activity  . Alcohol  use: No  . Drug use: No  . Sexual activity: Yes    Birth control/protection: None  Other Topics Concern  . Not on file  Social History Narrative  . Not on file   Social Determinants of Health   Financial Resource Strain:   . Difficulty of Paying Living Expenses: Not on file  Food Insecurity:   . Worried About Charity fundraiser in the Last Year: Not on file  . Ran Out of Food in the Last Year: Not on file  Transportation Needs:   . Lack of Transportation (Medical): Not on file  . Lack of Transportation (Non-Medical): Not on file  Physical Activity:   . Days of Exercise per Week: Not on file  . Minutes of Exercise per Session: Not on file  Stress:   . Feeling of Stress : Not on file  Social Connections:   . Frequency of Communication with Friends and Family: Not on file  . Frequency of Social Gatherings with Friends and Family: Not on file  . Attends Religious Services: Not on file  . Active Member of Clubs or Organizations: Not on file  . Attends Archivist Meetings: Not on file  . Marital Status: Not on file  Intimate Partner Violence:   .  Fear of Current or Ex-Partner: Not on file  . Emotionally Abused: Not on file  . Physically Abused: Not on file  . Sexually Abused: Not on file    ROS Review of Systems  Constitutional: Negative for appetite change, chills, fatigue, fever and unexpected weight change.  HENT: Negative for ear discharge, ear pain, sinus pressure, sinus pain, sore throat and trouble swallowing.   Eyes: Negative for visual disturbance.  Respiratory: Negative for shortness of breath.   Cardiovascular: Negative for chest pain and leg swelling.  Gastrointestinal: Negative for abdominal pain, nausea and vomiting.  Neurological: Negative for dizziness and weakness.    Objective:   Today's Vitals: BP 121/79   Pulse 93   Temp (!) 97.5 F (36.4 C) (Temporal)   Resp 20   Ht 5' (1.524 m)   Wt 122 lb 8 oz (55.6 kg)   SpO2 100%   BMI 23.92  kg/m   Physical Exam Vitals and nursing note reviewed.  Constitutional:      General: She is not in acute distress.    Appearance: Normal appearance. She is not ill-appearing, toxic-appearing or diaphoretic.  HENT:     Head:      Right Ear: Tympanic membrane, ear canal and external ear normal.     Left Ear: Tympanic membrane, ear canal and external ear normal.     Nose: Nose normal.     Mouth/Throat:     Mouth: Mucous membranes are moist.     Pharynx: Oropharynx is clear.  Eyes:     Extraocular Movements: Extraocular movements intact.     Conjunctiva/sclera: Conjunctivae normal.     Pupils: Pupils are equal, round, and reactive to light.  Cardiovascular:     Rate and Rhythm: Normal rate and regular rhythm.     Heart sounds: Normal heart sounds. No murmur heard.   Pulmonary:     Effort: Pulmonary effort is normal.     Breath sounds: Normal breath sounds.  Abdominal:     General: Abdomen is flat. Bowel sounds are normal. There is no distension.     Palpations: Abdomen is soft. There is no mass.     Tenderness: There is no abdominal tenderness. There is no guarding or rebound.  Musculoskeletal:     Cervical back: Normal range of motion and neck supple. No tenderness.     Right lower leg: No edema.     Left lower leg: No edema.  Lymphadenopathy:     Cervical: No cervical adenopathy.  Skin:    General: Skin is warm and dry.  Neurological:     General: No focal deficit present.     Mental Status: She is alert and oriented to person, place, and time.  Psychiatric:        Mood and Affect: Mood normal.        Behavior: Behavior normal.     Assessment & Plan:  Courtney Daniel was seen today for establish care and adenopathy.  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 No alarm signs today, will obtain CBC. Referral to plastic surgery for removal and biopsy if needed.  -     Ambulatory referral to Plastic Surgery -     CBC with  Differential/Platelet  Encounter for medical examination to establish care -     CBC with Differential/Platelet   Follow-up: 6 months for CPE.   The patient indicates understanding of these issues and agrees with the plan.   Harbor Paster  Lucila Maine, FNP

## 2020-01-30 ENCOUNTER — Encounter: Payer: Self-pay | Admitting: Plastic Surgery

## 2020-01-30 ENCOUNTER — Ambulatory Visit (INDEPENDENT_AMBULATORY_CARE_PROVIDER_SITE_OTHER): Payer: 59 | Admitting: Plastic Surgery

## 2020-01-30 ENCOUNTER — Other Ambulatory Visit: Payer: Self-pay

## 2020-01-30 VITALS — BP 125/77 | HR 89 | Temp 98.8°F | Ht 60.0 in | Wt 125.0 lb

## 2020-01-30 DIAGNOSIS — R221 Localized swelling, mass and lump, neck: Secondary | ICD-10-CM | POA: Diagnosis not present

## 2020-01-30 NOTE — Progress Notes (Signed)
   Referring Provider Gwenlyn Perking, [place],  [place] [id number]   CC:  Chief Complaint  Patient presents with  . Consult      Courtney Daniel is an 34 y.o. female.  HPI: Patient presents to discuss a mass behind her left ear.  Is been present for at least 3 years.  It started off small is gradually getting bigger.  It is intermittently painful particularly when she sleeps on that side.  She has not had any previous imaging or biopsies.  She is interested in having it surgically removed.  No Known Allergies  No outpatient encounter medications on file as of 01/30/2020.   No facility-administered encounter medications on file as of 01/30/2020.     Past Medical History:  Diagnosis Date  . Medical history non-contributory     Past Surgical History:  Procedure Laterality Date  . APPENDECTOMY  10/14/2013  . CESAREAN SECTION  09/16/2016 & 02/09/2019  . LAPAROSCOPIC APPENDECTOMY N/A 10/14/2013   Procedure: APPENDECTOMY LAPAROSCOPIC;  Surgeon: Gwenyth Ober, MD;  Location: [facility];  Service: General;  Laterality: N/A;  . NO PAST SURGERIES      Family History  Problem Relation Age of Onset  . Diabetes Other   . Hypertension Other   . Stroke Other   . Cancer Other        lung, pancreatic  . Diverticulitis Mother   . Hypertension Father   . Heart disease Father   . Cancer Maternal Grandmother        pancreatic  . COPD Maternal Grandfather   . Heart disease Paternal Grandfather     Social History   Social History Narrative  . Not on file     Review of Systems General: Denies fevers, chills, weight loss CV: Denies chest pain, shortness of breath, palpitations  Physical Exam Vitals with BMI 01/30/2020 12/25/2019 10/15/2013  Height 5\' 0"  5\' 0"  -  Weight 125 lbs 122 lbs 8 oz -  BMI 35.46 56.81 -  Systolic 275 170 017  Diastolic 77 79 73  Pulse 89 93 86    General:  No acute distress,  Alert and oriented, Non-Toxic, Normal speech and affect Examination  shows a 4 to 5 cm mass in the posterior auricular area behind the left ear.  This is firm and the overlying skin is somewhat red and atrophied.  It does not feel fixed to me but is difficult to say for sure.  Assessment/Plan Patient presents with a mass behind her left ear.  Is gradually getting bigger and is symptomatic.  I think excision is reasonable.  I will plan to get an ultrasound to start with 2 get a little bit more diagnostic information.  If it shows a superficial mass then I will plan to excise it.  I discussed the procedure with her in detail including the risk that include bleeding, infection, damage to surrounding structures and need for additional procedures.  All of her questions were answered and we will plan to move forward.  Cindra Presume 01/30/2020, 11:29 AM

## 2020-02-07 ENCOUNTER — Ambulatory Visit (HOSPITAL_COMMUNITY): Payer: 59

## 2020-02-13 ENCOUNTER — Ambulatory Visit (HOSPITAL_COMMUNITY): Payer: 59

## 2020-02-19 ENCOUNTER — Other Ambulatory Visit: Payer: Self-pay

## 2020-02-19 ENCOUNTER — Ambulatory Visit (HOSPITAL_COMMUNITY)
Admission: RE | Admit: 2020-02-19 | Discharge: 2020-02-19 | Disposition: A | Discharge status: Home / Self Care | Payer: 59 | Source: Ambulatory Visit | Attending: Plastic Surgery | Admitting: Plastic Surgery

## 2020-02-19 DIAGNOSIS — R221 Localized swelling, mass and lump, neck: Secondary | ICD-10-CM | POA: Diagnosis not present

## 2020-02-20 ENCOUNTER — Encounter: Payer: 59 | Admitting: Plastic Surgery

## 2020-02-25 ENCOUNTER — Encounter (HOSPITAL_BASED_OUTPATIENT_CLINIC_OR_DEPARTMENT_OTHER): Payer: Self-pay | Admitting: Plastic Surgery

## 2020-02-25 ENCOUNTER — Other Ambulatory Visit: Payer: Self-pay

## 2020-02-27 NOTE — H&P (View-Only) (Signed)
ICD-10-CM   1. Ear mass, left  H93.8X2       Patient ID: Courtney Daniel, female    DOB: 1985/06/08, 34 y.o.   MRN: [id number]   History of Present Illness: Courtney Daniel is a 34 y.o.  female  with a history of left posterior auricular.  She presents for preoperative evaluation for upcoming procedure, excision of left posterior auricular mass, scheduled for 03/04/2020 with Dr. Claudia Desanctis.  Summary from previous visit: Patient reports masses been present for at least 3 years. It started off small but is gradually increasing in size. It is intermittently painful, particularly when she sleeps on that side. Masses approximately 4 to 5 cm in the posterior auricular area behind the left ear. It is firm and overlying skin is somewhat red and atrophied.   Ultrasound results: Well-circumscribed ovoid subcutaneous mass measuring 4.3 x 2.4 x 3.6 cm with slightly heterogeneous echotexture. May be secondary to benign or malignant etiologies.  PMH Significant for: none  The patient has had problems with anesthesia. PONV  Past Medical History: Allergies: No Known Allergies  Current Medications: No current outpatient medications on file.  Past Medical Problems: Past Medical History:  Diagnosis Date  . Medical history non-contributory   . PONV (postoperative nausea and vomiting)     Past Surgical History: Past Surgical History:  Procedure Laterality Date  . APPENDECTOMY  10/14/2013  . CESAREAN SECTION  09/16/2016 & 02/09/2019  . LAPAROSCOPIC APPENDECTOMY N/A 10/14/2013   Procedure: APPENDECTOMY LAPAROSCOPIC;  Surgeon: Gwenyth Ober, MD;  Location: [place];  Service: General;  Laterality: N/A;  . NO PAST SURGERIES      Social History: Social History   Socioeconomic History  . Marital status: Married    Spouse name: Not on file  . Number of children: Not on file  . Years of education: Not on file  . Highest education level: Not on file  Occupational History  . Not on file  Tobacco Use   . Smoking status: Never Smoker  . Smokeless tobacco: Never Used  Substance and Sexual Activity  . Alcohol use: No  . Drug use: No  . Sexual activity: Yes    Birth control/protection: None  Other Topics Concern  . Not on file  Social History Narrative  . Not on file   Social Determinants of Health   Financial Resource Strain: Not on file  Food Insecurity: Not on file  Transportation Needs: Not on file  Physical Activity: Not on file  Stress: Not on file  Social Connections: Not on file  Intimate Partner Violence: Not on file    Family History: Family History  Problem Relation Age of Onset  . Diabetes Other   . Hypertension Other   . Stroke Other   . Cancer Other        lung, pancreatic  . Diverticulitis Mother   . Hypertension Father   . Heart disease Father   . Cancer Maternal Grandmother        pancreatic  . COPD Maternal Grandfather   . Heart disease Paternal Grandfather     Review of Systems: Review of Systems  Constitutional: Negative for chills and fever.  HENT: Negative for congestion and sore throat.        Large mass behind left ear  Respiratory: Negative for cough and shortness of breath.   Cardiovascular: Negative for chest pain.  Gastrointestinal: Negative for abdominal pain, nausea and vomiting.  Skin: Negative for itching and rash.  Physical Exam: Vital Signs BP 121/78 (BP Location: Left Arm, Patient Position: Sitting, Cuff Size: Normal)   Pulse 84   Temp 98.3 F (36.8 C) (Oral)   Ht 5' (1.524 m)   Wt 125 lb 3.2 oz (56.8 kg)   SpO2 100%   BMI 24.45 kg/m  Physical Exam Vitals and nursing note reviewed.  Constitutional:      General: She is not in acute distress.    Appearance: Normal appearance. She is normal weight. She is not ill-appearing.  HENT:     Head: Normocephalic and atraumatic.     Comments: Large mass posterior left ear Eyes:     Extraocular Movements: Extraocular movements intact.  Cardiovascular:     Rate and  Rhythm: Normal rate and regular rhythm.     Pulses: Normal pulses.     Heart sounds: Normal heart sounds.  Pulmonary:     Effort: Pulmonary effort is normal.     Breath sounds: Normal breath sounds. No wheezing, rhonchi or rales.  Abdominal:     General: Bowel sounds are normal.     Palpations: Abdomen is soft.  Musculoskeletal:        General: No swelling. Normal range of motion.     Cervical back: Normal range of motion.  Skin:    General: Skin is warm and dry.     Coloration: Skin is not pale.     Findings: No erythema or rash.  Neurological:     General: No focal deficit present.     Mental Status: She is alert and oriented to person, place, and time.  Psychiatric:        Mood and Affect: Mood normal.        Behavior: Behavior normal.        Thought Content: Thought content normal.        Judgment: Judgment normal.     Assessment/Plan:  Ms. Moorhouse scheduled for excision of left posterior auricular mass with Dr. Claudia Desanctis.  Risks, benefits, and alternatives of procedure discussed, questions answered and consent obtained.    Smoking Status: non-smoker; Counseling Given? N/A  Caprini Score: low; Risk Factors include: 34 year old female, BMI < 25, and length of planned surgery. Recommendation for mechanical prophylaxis during surgery. Encourage early ambulation.   Post-op Rx sent to pharmacy: Norco, Zofran, Tylenol, Ibuprofen  Patient was provided with the General Surgical Risk consent document and Pain Medication Agreement prior to their appointment.  They had adequate time to read through the risk consent documents and Pain Medication Agreement. We also discussed them in person together during this preop appointment. All of their questions were answered to their satisfaction.  Recommended calling if they have any further questions.  Risk consent form and Pain Medication Agreement to be scanned into patient's chart.  Electronically signed by: Threasa Heads, PA-C 02/28/2020 3:37  PM

## 2020-02-27 NOTE — Progress Notes (Signed)
ICD-10-CM   1. Ear mass, left  H93.8X2       Patient ID: Courtney Daniel, female    DOB: 1985/06/08, 34 y.o.   MRN: [id number]   History of Present Illness: Courtney Daniel is a 34 y.o.  female  with a history of left posterior auricular.  She presents for preoperative evaluation for upcoming procedure, excision of left posterior auricular mass, scheduled for 03/04/2020 with Dr. Claudia Desanctis.  Summary from previous visit: Patient reports masses been present for at least 3 years. It started off small but is gradually increasing in size. It is intermittently painful, particularly when she sleeps on that side. Masses approximately 4 to 5 cm in the posterior auricular area behind the left ear. It is firm and overlying skin is somewhat red and atrophied.   Ultrasound results: Well-circumscribed ovoid subcutaneous mass measuring 4.3 x 2.4 x 3.6 cm with slightly heterogeneous echotexture. May be secondary to benign or malignant etiologies.  PMH Significant for: none  The patient has had problems with anesthesia. PONV  Past Medical History: Allergies: No Known Allergies  Current Medications: No current outpatient medications on file.  Past Medical Problems: Past Medical History:  Diagnosis Date  . Medical history non-contributory   . PONV (postoperative nausea and vomiting)     Past Surgical History: Past Surgical History:  Procedure Laterality Date  . APPENDECTOMY  10/14/2013  . CESAREAN SECTION  09/16/2016 & 02/09/2019  . LAPAROSCOPIC APPENDECTOMY N/A 10/14/2013   Procedure: APPENDECTOMY LAPAROSCOPIC;  Surgeon: Gwenyth Ober, MD;  Location: [place];  Service: General;  Laterality: N/A;  . NO PAST SURGERIES      Social History: Social History   Socioeconomic History  . Marital status: Married    Spouse name: Not on file  . Number of children: Not on file  . Years of education: Not on file  . Highest education level: Not on file  Occupational History  . Not on file  Tobacco Use   . Smoking status: Never Smoker  . Smokeless tobacco: Never Used  Substance and Sexual Activity  . Alcohol use: No  . Drug use: No  . Sexual activity: Yes    Birth control/protection: None  Other Topics Concern  . Not on file  Social History Narrative  . Not on file   Social Determinants of Health   Financial Resource Strain: Not on file  Food Insecurity: Not on file  Transportation Needs: Not on file  Physical Activity: Not on file  Stress: Not on file  Social Connections: Not on file  Intimate Partner Violence: Not on file    Family History: Family History  Problem Relation Age of Onset  . Diabetes Other   . Hypertension Other   . Stroke Other   . Cancer Other        lung, pancreatic  . Diverticulitis Mother   . Hypertension Father   . Heart disease Father   . Cancer Maternal Grandmother        pancreatic  . COPD Maternal Grandfather   . Heart disease Paternal Grandfather     Review of Systems: Review of Systems  Constitutional: Negative for chills and fever.  HENT: Negative for congestion and sore throat.        Large mass behind left ear  Respiratory: Negative for cough and shortness of breath.   Cardiovascular: Negative for chest pain.  Gastrointestinal: Negative for abdominal pain, nausea and vomiting.  Skin: Negative for itching and rash.  Physical Exam: Vital Signs BP 121/78 (BP Location: Left Arm, Patient Position: Sitting, Cuff Size: Normal)   Pulse 84   Temp 98.3 F (36.8 C) (Oral)   Ht 5' (1.524 m)   Wt 125 lb 3.2 oz (56.8 kg)   SpO2 100%   BMI 24.45 kg/m  Physical Exam Vitals and nursing note reviewed.  Constitutional:      General: She is not in acute distress.    Appearance: Normal appearance. She is normal weight. She is not ill-appearing.  HENT:     Head: Normocephalic and atraumatic.     Comments: Large mass posterior left ear Eyes:     Extraocular Movements: Extraocular movements intact.  Cardiovascular:     Rate and  Rhythm: Normal rate and regular rhythm.     Pulses: Normal pulses.     Heart sounds: Normal heart sounds.  Pulmonary:     Effort: Pulmonary effort is normal.     Breath sounds: Normal breath sounds. No wheezing, rhonchi or rales.  Abdominal:     General: Bowel sounds are normal.     Palpations: Abdomen is soft.  Musculoskeletal:        General: No swelling. Normal range of motion.     Cervical back: Normal range of motion.  Skin:    General: Skin is warm and dry.     Coloration: Skin is not pale.     Findings: No erythema or rash.  Neurological:     General: No focal deficit present.     Mental Status: She is alert and oriented to person, place, and time.  Psychiatric:        Mood and Affect: Mood normal.        Behavior: Behavior normal.        Thought Content: Thought content normal.        Judgment: Judgment normal.     Assessment/Plan:  Ms. Landress scheduled for excision of left posterior auricular mass with Dr. Pace.  Risks, benefits, and alternatives of procedure discussed, questions answered and consent obtained.    Smoking Status: non-smoker; Counseling Given? N/A  Caprini Score: low; Risk Factors include: 34-year-old female, BMI < 25, and length of planned surgery. Recommendation for mechanical prophylaxis during surgery. Encourage early ambulation.   Post-op Rx sent to pharmacy: Norco, Zofran, Tylenol, Ibuprofen  Patient was provided with the General Surgical Risk consent document and Pain Medication Agreement prior to their appointment.  They had adequate time to read through the risk consent documents and Pain Medication Agreement. We also discussed them in person together during this preop appointment. All of their questions were answered to their satisfaction.  Recommended calling if they have any further questions.  Risk consent form and Pain Medication Agreement to be scanned into patient's chart.  Electronically signed by: Saed Hudlow C Erby Sanderson, PA-C 02/28/2020 3:37  PM          

## 2020-02-28 ENCOUNTER — Other Ambulatory Visit: Payer: Self-pay

## 2020-02-28 ENCOUNTER — Ambulatory Visit (INDEPENDENT_AMBULATORY_CARE_PROVIDER_SITE_OTHER): Payer: 59 | Admitting: Plastic Surgery

## 2020-02-28 ENCOUNTER — Encounter: Payer: Self-pay | Admitting: Plastic Surgery

## 2020-02-28 VITALS — BP 121/78 | HR 84 | Temp 98.3°F | Ht 60.0 in | Wt 125.2 lb

## 2020-02-28 DIAGNOSIS — H938X2 Other specified disorders of left ear: Secondary | ICD-10-CM

## 2020-02-28 MED ORDER — ONDANSETRON HCL 4 MG PO TABS: 4.0000 mg | ORAL_TABLET | Freq: Three times a day (TID) | ORAL | 0 refills | Status: AC | PRN

## 2020-02-28 MED ORDER — HYDROCODONE-ACETAMINOPHEN 5-325 MG PO TABS: 1.0000 | ORAL_TABLET | Freq: Three times a day (TID) | ORAL | 0 refills | Status: AC | PRN

## 2020-02-28 MED ORDER — IBUPROFEN 600 MG PO TABS: 600.0000 mg | ORAL_TABLET | Freq: Four times a day (QID) | ORAL | 0 refills | Status: AC | PRN

## 2020-02-28 MED ORDER — ACETAMINOPHEN 500 MG PO TABS: 500.0000 mg | ORAL_TABLET | Freq: Four times a day (QID) | ORAL | 0 refills | Status: AC | PRN

## 2020-03-03 ENCOUNTER — Other Ambulatory Visit (HOSPITAL_COMMUNITY)
Admission: RE | Admit: 2020-03-03 | Discharge: 2020-03-03 | Disposition: A | Discharge status: Home / Self Care | Payer: 59 | Source: Ambulatory Visit | Attending: Plastic Surgery | Admitting: Plastic Surgery

## 2020-03-03 DIAGNOSIS — Z20822 Contact with and (suspected) exposure to covid-19: Secondary | ICD-10-CM | POA: Diagnosis not present

## 2020-03-03 DIAGNOSIS — Z01812 Encounter for preprocedural laboratory examination: Secondary | ICD-10-CM | POA: Diagnosis not present

## 2020-03-03 LAB — SARS CORONAVIRUS 2 (TAT 6-24 HRS): SARS Coronavirus 2: NEGATIVE

## 2020-03-03 NOTE — Anesthesia Preprocedure Evaluation (Addendum)
Anesthesia Evaluation  Patient identified by MRN, date of birth, ID band Patient awake    Reviewed: Allergy & Precautions, NPO status , Patient's Chart, lab work & pertinent test results  Airway Mallampati: I  TM Distance: >3 FB Neck ROM: Full    Dental no notable dental hx. (+) Teeth Intact, Dental Advisory Given   Pulmonary neg pulmonary ROS,    Pulmonary exam normal breath sounds clear to auscultation       Cardiovascular Exercise Tolerance: Good negative cardio ROS Normal cardiovascular exam Rhythm:Regular Rate:Normal     Neuro/Psych negative neurological ROS  negative psych ROS   GI/Hepatic negative GI ROS, Neg liver ROS,   Endo/Other  negative endocrine ROS  Renal/GU negative Renal ROS     Musculoskeletal negative musculoskeletal ROS (+)   Abdominal   Peds  Hematology   Anesthesia Other Findings   Reproductive/Obstetrics negative OB ROS                            Anesthesia Physical Anesthesia Plan  ASA: I  Anesthesia Plan: General   Post-op Pain Management:    Induction: Intravenous  PONV Risk Score and Plan: 4 or greater and Treatment may vary due to age or medical condition, Ondansetron, Dexamethasone, Midazolam and Scopolamine patch - Pre-op  Airway Management Planned: LMA  Additional Equipment: None  Intra-op Plan:   Post-operative Plan:   Informed Consent: I have reviewed the patients History and Physical, chart, labs and discussed the procedure including the risks, benefits and alternatives for the proposed anesthesia with the patient or authorized representative who has indicated his/her understanding and acceptance.     Dental advisory given  Plan Discussed with: CRNA and Anesthesiologist  Anesthesia Plan Comments:       Anesthesia Quick Evaluation

## 2020-03-04 ENCOUNTER — Encounter (HOSPITAL_BASED_OUTPATIENT_CLINIC_OR_DEPARTMENT_OTHER): Payer: Self-pay | Admitting: Plastic Surgery

## 2020-03-04 ENCOUNTER — Ambulatory Visit (HOSPITAL_BASED_OUTPATIENT_CLINIC_OR_DEPARTMENT_OTHER)
Admission: RE | Admit: 2020-03-04 | Discharge: 2020-03-04 | Disposition: A | Discharge status: Home / Self Care | Payer: 59 | Attending: Plastic Surgery | Admitting: Plastic Surgery

## 2020-03-04 ENCOUNTER — Ambulatory Visit (HOSPITAL_BASED_OUTPATIENT_CLINIC_OR_DEPARTMENT_OTHER): Payer: 59 | Admitting: Anesthesiology

## 2020-03-04 ENCOUNTER — Other Ambulatory Visit: Payer: Self-pay

## 2020-03-04 ENCOUNTER — Encounter (HOSPITAL_BASED_OUTPATIENT_CLINIC_OR_DEPARTMENT_OTHER)
Admission: RE | Disposition: A | Discharge status: Home / Self Care | Payer: Self-pay | Source: Home / Self Care | Attending: Plastic Surgery

## 2020-03-04 DIAGNOSIS — H938X2 Other specified disorders of left ear: Secondary | ICD-10-CM | POA: Diagnosis present

## 2020-03-04 DIAGNOSIS — Q181 Preauricular sinus and cyst: Secondary | ICD-10-CM | POA: Diagnosis not present

## 2020-03-04 DIAGNOSIS — H70819 Postauricular fistula, unspecified ear: Secondary | ICD-10-CM | POA: Diagnosis not present

## 2020-03-04 DIAGNOSIS — D170 Benign lipomatous neoplasm of skin and subcutaneous tissue of head, face and neck: Secondary | ICD-10-CM | POA: Diagnosis not present

## 2020-03-04 HISTORY — PX: MASS EXCISION: SHX2000

## 2020-03-04 HISTORY — DX: Nausea with vomiting, unspecified: R11.2

## 2020-03-04 HISTORY — DX: Other specified postprocedural states: Z98.890

## 2020-03-04 LAB — POCT PREGNANCY, URINE: Preg Test, Ur: NEGATIVE

## 2020-03-04 SURGERY — EXCISION MASS
Anesthesia: General | Site: Ear | Laterality: Left

## 2020-03-04 MED ORDER — AMISULPRIDE (ANTIEMETIC) 5 MG/2ML IV SOLN
INTRAVENOUS | Status: AC
  Filled 2020-03-04: qty 2

## 2020-03-04 MED ORDER — OXYCODONE HCL 5 MG/5ML PO SOLN
5.0000 mg | Freq: Once | ORAL | Status: DC | PRN
Start: 2020-03-04 — End: 2020-03-04

## 2020-03-04 MED ORDER — CEFAZOLIN SODIUM-DEXTROSE 2-4 GM/100ML-% IV SOLN
2.0000 g | INTRAVENOUS | Status: AC
  Administered 2020-03-04: 2 g via INTRAVENOUS

## 2020-03-04 MED ORDER — GLYCOPYRROLATE 0.2 MG/ML IJ SOLN
INTRAMUSCULAR | Status: AC
  Filled 2020-03-04: qty 1

## 2020-03-04 MED ORDER — DEXAMETHASONE SODIUM PHOSPHATE 10 MG/ML IJ SOLN
INTRAMUSCULAR | Status: DC | PRN
  Administered 2020-03-04: 5 mg via INTRAVENOUS

## 2020-03-04 MED ORDER — PHENYLEPHRINE HCL (PRESSORS) 10 MG/ML IV SOLN
INTRAVENOUS | Status: DC | PRN
  Administered 2020-03-04 (×3): 80 ug via INTRAVENOUS

## 2020-03-04 MED ORDER — MIDAZOLAM HCL 2 MG/2ML IJ SOLN
INTRAMUSCULAR | Status: AC
  Filled 2020-03-04: qty 2

## 2020-03-04 MED ORDER — EPINEPHRINE PF 1 MG/ML IJ SOLN
INTRAMUSCULAR | Status: AC
  Filled 2020-03-04: qty 1

## 2020-03-04 MED ORDER — BUPIVACAINE HCL (PF) 0.25 % IJ SOLN
INTRAMUSCULAR | Status: AC
  Filled 2020-03-04: qty 30

## 2020-03-04 MED ORDER — LACTATED RINGERS IV SOLN: INTRAVENOUS | Status: DC

## 2020-03-04 MED ORDER — LIDOCAINE 2% (20 MG/ML) 5 ML SYRINGE
INTRAMUSCULAR | Status: AC
  Filled 2020-03-04: qty 5

## 2020-03-04 MED ORDER — PROPOFOL 10 MG/ML IV BOLUS
INTRAVENOUS | Status: DC | PRN
  Administered 2020-03-04: 200 mg via INTRAVENOUS

## 2020-03-04 MED ORDER — ONDANSETRON HCL 4 MG/2ML IJ SOLN
INTRAMUSCULAR | Status: DC | PRN
  Administered 2020-03-04: 4 mg via INTRAVENOUS

## 2020-03-04 MED ORDER — ACETAMINOPHEN 10 MG/ML IV SOLN: 1000.0000 mg | Freq: Once | INTRAVENOUS | Status: DC | PRN

## 2020-03-04 MED ORDER — DEXAMETHASONE SODIUM PHOSPHATE 10 MG/ML IJ SOLN
INTRAMUSCULAR | Status: AC
  Filled 2020-03-04: qty 1

## 2020-03-04 MED ORDER — PHENYLEPHRINE 40 MCG/ML (10ML) SYRINGE FOR IV PUSH (FOR BLOOD PRESSURE SUPPORT)
PREFILLED_SYRINGE | INTRAVENOUS | Status: AC
  Filled 2020-03-04: qty 10

## 2020-03-04 MED ORDER — FENTANYL CITRATE (PF) 100 MCG/2ML IJ SOLN
INTRAMUSCULAR | Status: DC | PRN
  Administered 2020-03-04 (×2): 50 ug via INTRAVENOUS

## 2020-03-04 MED ORDER — CEFAZOLIN SODIUM-DEXTROSE 2-4 GM/100ML-% IV SOLN
INTRAVENOUS | Status: AC
  Filled 2020-03-04: qty 100

## 2020-03-04 MED ORDER — SCOPOLAMINE 1 MG/3DAYS TD PT72
1.0000 | MEDICATED_PATCH | TRANSDERMAL | Status: DC
  Administered 2020-03-04: 09:00:00 1.5 mg via TRANSDERMAL

## 2020-03-04 MED ORDER — DEXMEDETOMIDINE (PRECEDEX) IN NS 20 MCG/5ML (4 MCG/ML) IV SYRINGE
PREFILLED_SYRINGE | INTRAVENOUS | Status: DC | PRN
  Administered 2020-03-04 (×2): 8 ug via INTRAVENOUS
  Administered 2020-03-04: 4 ug via INTRAVENOUS

## 2020-03-04 MED ORDER — GLYCOPYRROLATE 0.2 MG/ML IJ SOLN
INTRAMUSCULAR | Status: DC | PRN
  Administered 2020-03-04: .2 mg via INTRAVENOUS

## 2020-03-04 MED ORDER — AMISULPRIDE (ANTIEMETIC) 5 MG/2ML IV SOLN
10.0000 mg | Freq: Once | INTRAVENOUS | Status: AC | PRN
  Administered 2020-03-04: 12:00:00 10 mg via INTRAVENOUS

## 2020-03-04 MED ORDER — BUPIVACAINE HCL (PF) 0.25 % IJ SOLN
INTRAMUSCULAR | Status: DC | PRN
  Administered 2020-03-04: 30 mL

## 2020-03-04 MED ORDER — OXYCODONE HCL 5 MG PO TABS: 5.0000 mg | ORAL_TABLET | Freq: Once | ORAL | Status: DC | PRN

## 2020-03-04 MED ORDER — MEPERIDINE HCL 25 MG/ML IJ SOLN
6.2500 mg | INTRAMUSCULAR | Status: DC | PRN
Start: 2020-03-04 — End: 2020-03-04

## 2020-03-04 MED ORDER — LIDOCAINE-EPINEPHRINE 1 %-1:100000 IJ SOLN
INTRAMUSCULAR | Status: DC | PRN
  Administered 2020-03-04: 20 mL

## 2020-03-04 MED ORDER — LIDOCAINE-EPINEPHRINE 1 %-1:100000 IJ SOLN
INTRAMUSCULAR | Status: AC
  Filled 2020-03-04: qty 1

## 2020-03-04 MED ORDER — ONDANSETRON HCL 4 MG/2ML IJ SOLN: 4.0000 mg | Freq: Once | INTRAMUSCULAR | Status: DC | PRN

## 2020-03-04 MED ORDER — DEXMEDETOMIDINE (PRECEDEX) IN NS 20 MCG/5ML (4 MCG/ML) IV SYRINGE
PREFILLED_SYRINGE | INTRAVENOUS | Status: AC
  Filled 2020-03-04: qty 5

## 2020-03-04 MED ORDER — FENTANYL CITRATE (PF) 100 MCG/2ML IJ SOLN
INTRAMUSCULAR | Status: AC
  Filled 2020-03-04: qty 2

## 2020-03-04 MED ORDER — ONDANSETRON HCL 4 MG/2ML IJ SOLN
INTRAMUSCULAR | Status: AC
  Filled 2020-03-04: qty 2

## 2020-03-04 MED ORDER — HYDROMORPHONE HCL 1 MG/ML IJ SOLN: 0.2500 mg | INTRAMUSCULAR | Status: DC | PRN

## 2020-03-04 MED ORDER — SCOPOLAMINE 1 MG/3DAYS TD PT72
MEDICATED_PATCH | TRANSDERMAL | Status: AC
  Filled 2020-03-04: qty 1

## 2020-03-04 MED ORDER — MIDAZOLAM HCL 2 MG/2ML IJ SOLN
INTRAMUSCULAR | Status: DC | PRN
  Administered 2020-03-04: 2 mg via INTRAVENOUS

## 2020-03-04 MED ORDER — PROPOFOL 500 MG/50ML IV EMUL
INTRAVENOUS | Status: AC
  Filled 2020-03-04: qty 50

## 2020-03-04 MED ORDER — PROPOFOL 500 MG/50ML IV EMUL
INTRAVENOUS | Status: DC | PRN
  Administered 2020-03-04: 25 ug/kg/min via INTRAVENOUS

## 2020-03-04 SURGICAL SUPPLY — 58 items
ADH SKN CLS APL DERMABOND .7 (GAUZE/BANDAGES/DRESSINGS)
APL SKNCLS STERI-STRIP NONHPOA (GAUZE/BANDAGES/DRESSINGS)
BAND INSRT 18 STRL LF DISP RB (MISCELLANEOUS)
BAND RUBBER #18 3X1/16 STRL (MISCELLANEOUS) IMPLANT
BENZOIN TINCTURE PRP APPL 2/3 (GAUZE/BANDAGES/DRESSINGS) IMPLANT
BLADE CLIPPER SURG (BLADE) IMPLANT
BLADE SURG 15 STRL LF DISP TIS (BLADE) ×1 IMPLANT
BLADE SURG 15 STRL SS (BLADE) ×3
CANISTER SUCT 1200ML W/VALVE (MISCELLANEOUS) IMPLANT
CLOSURE WOUND 1/2 X4 (GAUZE/BANDAGES/DRESSINGS)
COVER BACK TABLE 60X90IN (DRAPES) ×3 IMPLANT
COVER MAYO STAND STRL (DRAPES) ×3 IMPLANT
COVER WAND RF STERILE (DRAPES) IMPLANT
DERMABOND ADVANCED (GAUZE/BANDAGES/DRESSINGS)
DERMABOND ADVANCED .7 DNX12 (GAUZE/BANDAGES/DRESSINGS) IMPLANT
DRAPE U-SHAPE 76X120 STRL (DRAPES) ×3 IMPLANT
DRAPE UTILITY XL STRL (DRAPES) ×3 IMPLANT
ELECT COATED BLADE 2.86 ST (ELECTRODE) IMPLANT
ELECT NEEDLE BLADE 2-5/6 (NEEDLE) ×3 IMPLANT
ELECT REM PT RETURN 9FT ADLT (ELECTROSURGICAL)
ELECT REM PT RETURN 9FT PED (ELECTROSURGICAL)
ELECTRODE REM PT RETRN 9FT PED (ELECTROSURGICAL) IMPLANT
ELECTRODE REM PT RTRN 9FT ADLT (ELECTROSURGICAL) IMPLANT
EVACUATOR SILICONE 100CC (DRAIN) IMPLANT
GAUZE SPONGE 4X4 12PLY STRL LF (GAUZE/BANDAGES/DRESSINGS) IMPLANT
GLOVE SRG 8 PF TXTR STRL LF DI (GLOVE) ×1 IMPLANT
GLOVE SURG ENC TEXT LTX SZ7.5 (GLOVE) ×9 IMPLANT
GLOVE SURG UNDER POLY LF SZ8 (GLOVE) ×3
GOWN STRL REUS W/ TWL LRG LVL3 (GOWN DISPOSABLE) ×3 IMPLANT
GOWN STRL REUS W/TWL LRG LVL3 (GOWN DISPOSABLE) ×9
NEEDLE HYPO 30GX1 BEV (NEEDLE) IMPLANT
NEEDLE PRECISIONGLIDE 27X1.5 (NEEDLE) ×3 IMPLANT
NS IRRIG 1000ML POUR BTL (IV SOLUTION) IMPLANT
PACK BASIN DAY SURGERY FS (CUSTOM PROCEDURE TRAY) ×3 IMPLANT
PENCIL SMOKE EVACUATOR (MISCELLANEOUS) ×3 IMPLANT
SLEEVE SCD COMPRESS KNEE MED (MISCELLANEOUS) IMPLANT
SPONGE GAUZE 2X2 8PLY STER LF (GAUZE/BANDAGES/DRESSINGS)
SPONGE GAUZE 2X2 8PLY STRL LF (GAUZE/BANDAGES/DRESSINGS) IMPLANT
STAPLER VISISTAT 35W (STAPLE) ×3 IMPLANT
STRIP CLOSURE SKIN 1/2X4 (GAUZE/BANDAGES/DRESSINGS) IMPLANT
SUCTION FRAZIER HANDLE 10FR (MISCELLANEOUS)
SUCTION TUBE FRAZIER 10FR DISP (MISCELLANEOUS) IMPLANT
SUT ETHILON 4 0 PS 2 18 (SUTURE) IMPLANT
SUT MNCRL AB 4-0 PS2 18 (SUTURE) IMPLANT
SUT MON AB 5-0 P3 18 (SUTURE) IMPLANT
SUT PDS 3-0 CT2 (SUTURE)
SUT PDS II 3-0 CT2 27 ABS (SUTURE) IMPLANT
SUT PLAIN 5 0 P 3 18 (SUTURE) IMPLANT
SUT PROLENE 5 0 P 3 (SUTURE) IMPLANT
SUT VICRYL 4-0 PS2 18IN ABS (SUTURE) IMPLANT
SWAB COLLECTION DEVICE MRSA (MISCELLANEOUS) IMPLANT
SWAB CULTURE ESWAB REG 1ML (MISCELLANEOUS) IMPLANT
SYR BULB EAR ULCER 3OZ GRN STR (SYRINGE) IMPLANT
SYR CONTROL 10ML LL (SYRINGE) ×3 IMPLANT
TOWEL GREEN STERILE FF (TOWEL DISPOSABLE) ×3 IMPLANT
TRAY DSU PREP LF (CUSTOM PROCEDURE TRAY) IMPLANT
TUBE CONNECTING 20'X1/4 (TUBING)
TUBE CONNECTING 20X1/4 (TUBING) IMPLANT

## 2020-03-04 NOTE — Interval H&P Note (Signed)
History and Physical Interval Note:  03/04/2020 9:51 AM  Courtney Daniel  has presented today for surgery, with the diagnosis of neck mass.  The various methods of treatment have been discussed with the patient and family. After consideration of risks, benefits and other options for treatment, the patient has consented to  Procedure(s) with comments: Excision of left posterior auricular mass (Left) - 45 min, please as a surgical intervention.  The patient's history has been reviewed, patient examined, no change in status, stable for surgery.  I have reviewed the patient's chart and labs.  Questions were answered to the patient's satisfaction.     Allena Napoleon

## 2020-03-04 NOTE — Brief Op Note (Signed)
03/04/2020  11:32 AM  PATIENT:  Courtney Daniel  34 y.o. female  PRE-OPERATIVE DIAGNOSIS:  left posterior auricular mass  POST-OPERATIVE DIAGNOSIS:  left posterior auricular mass  PROCEDURE:  Procedure(s) with comments: Excision of left posterior auricular mass (Left) - 45 min, please  SURGEON:  Surgeon(s) and Role:    * Anneth Brunell, Wendy Poet, MD - Primary  PHYSICIAN ASSISTANT: Joni Fears, PA  ASSISTANTS: none   ANESTHESIA:   general  EBL:  20 mL   BLOOD ADMINISTERED:none  DRAINS: none   LOCAL MEDICATIONS USED:  MARCAINE     SPECIMEN:  Source of Specimen:  Left posterior auricular cyst  DISPOSITION OF SPECIMEN:  PATHOLOGY  COUNTS:  YES  TOURNIQUET:  * No tourniquets in log *  DICTATION: .Dragon Dictation  PLAN OF CARE: Discharge to home after PACU  PATIENT DISPOSITION:  PACU - hemodynamically stable.   Delay start of Pharmacological VTE agent (>24hrs) due to surgical blood loss or risk of bleeding: not applicable

## 2020-03-04 NOTE — Discharge Instructions (Signed)
     Post Anesthesia Home Care Instructions  Activity: Get plenty of rest for the remainder of the day. A responsible individual must stay with you for 24 hours following the procedure.  For the next 24 hours, DO NOT: -Drive a car -Advertising copywriter -Drink alcoholic beverages -Take any medication unless instructed by your physician -Make any legal decisions or sign important papers.  Meals: Start with liquid foods such as gelatin or soup. Progress to regular foods as tolerated. Avoid greasy, spicy, heavy foods. If nausea and/or vomiting occur, drink only clear liquids until the nausea and/or vomiting subsides. Call your physician if vomiting continues.  Special Instructions/Symptoms: Your throat may feel dry or sore from the anesthesia or the breathing tube placed in your throat during surgery. If this causes discomfort, gargle with warm salt water. The discomfort should disappear within 24 hours.  If you had a scopolamine patch placed behind your ear for the management of post- operative nausea and/or vomiting:  1. The medication in the patch is effective for 72 hours, after which it should be removed.  Wrap patch in a tissue and discard in the trash. Wash hands thoroughly with soap and water. 2. You may remove the patch earlier than 72 hours if you experience unpleasant side effects which may include dry mouth, dizziness or visual disturbances. 3. Avoid touching the patch. Wash your hands with soap and water after contact with the patch.       Activity: As tolerated, but avoid strenuous activity until follow up visit.  Diet: Regular  Wound Care: Keep dressing clean & dry today. You can shower normally tomorrow.  Redress the wound as needed for comfort.  Special Instructions:  Call our office if any unusual problems occur such as pain, excessive bleeding, unrelieved nausea/vomiting, fever &/or chills.  Follow-up appointment: Scheduled for next week; Jan 5.

## 2020-03-04 NOTE — Anesthesia Postprocedure Evaluation (Signed)
Anesthesia Post Note  Patient: Courtney Daniel  Procedure(s) Performed: Excision of left posterior auricular mass (Left Ear)     Patient location during evaluation: PACU Anesthesia Type: General Level of consciousness: awake and alert Pain management: pain level controlled Vital Signs Assessment: post-procedure vital signs reviewed and stable Respiratory status: spontaneous breathing, nonlabored ventilation, respiratory function stable and patient connected to nasal cannula oxygen Cardiovascular status: blood pressure returned to baseline and stable Postop Assessment: no apparent nausea or vomiting Anesthetic complications: no   No complications documented.  Last Vitals:  Vitals:   03/04/20 1215 03/04/20 1245  BP: 112/72 118/81  Pulse: 98 97  Resp: 17 16  Temp:  36.7 C  SpO2: 98% 100%    Last Pain:  Vitals:   03/04/20 1245  TempSrc:   PainSc: 0-No pain                 Trevor Iha

## 2020-03-04 NOTE — Op Note (Signed)
Operative Note   DATE OF OPERATION: 03/04/2020  SURGICAL DEPARTMENT: Plastic Surgery  PREOPERATIVE DIAGNOSES: Left posterior auricular cyst  POSTOPERATIVE DIAGNOSES:  same  PROCEDURE: 1.  Excision of left posterior auricular cyst totaling 4 cm in size 2.  Complex closure totaling 4 cm in length  SURGEON: Ancil Linsey, MD  ASSISTANT: Joni Fears, PA The advanced practice practitioner (APP) assisted throughout the case.  The APP was essential in retraction and counter traction when needed to make the case progress smoothly.  This retraction and assistance made it possible to see the tissue plans for the procedure.  The assistance was needed for blood control, tissue re-approximation and assisted with closure of the incision site.  ANESTHESIA:  General.   COMPLICATIONS: None.   INDICATIONS FOR PROCEDURE:  The patient, Courtney Daniel is a 34 y.o. female born on 06-10-1985, is here for treatment of symptomatic posterior regular cyst MRN: [id number]  CONSENT:  Informed consent was obtained directly from the patient. Risks, benefits and alternatives were fully discussed. Specific risks including but not limited to bleeding, infection, hematoma, seroma, scarring, pain, contracture, asymmetry, wound healing problems, and need for further surgery were all discussed. The patient did have an ample opportunity to have questions answered to satisfaction.   DESCRIPTION OF PROCEDURE:  The patient was taken to the operating room. SCDs were placed and antibiotics were given.  General anesthesia was administered.  The patient's operative site was prepped and draped in a sterile fashion. A time out was performed and all information was confirmed to be correct.  I marked out a transversely oriented ellipse to keep the scar behind the ear.  I then infiltrated combination of lidocaine and Marcaine for local anesthetic.  This was given time to work.  I then made my incision with a 15 blade.  The cyst  was directly beneath the skin and then I proceeded to dissect around it carefully using spreading dissection with tenotomy scissors.  The cyst came down to be fairly adherent to the sternocleidomastoid and the mastoid itself.  At this point cautery was used to lift the mass off the mastoid and the portion of the sternocleidomastoid that it was attached to.  The dissection stayed posterior and superior to the tip of the mastoid.  The mass was excised in its entirety without rupturing and passed off the field.  Meticulous hemostasis was obtained and the wound was irrigated.  The skin was then undermined circumferentially and advanced and closed in layers with interrupted buried 3-0 Monocryl sutures and a running 4-0 Monocryl.  Soft dressing was then applied.  The patient tolerated the procedure well.  There were no complications. The patient was allowed to wake from anesthesia, extubated and taken to the recovery room in satisfactory condition.

## 2020-03-04 NOTE — Transfer of Care (Signed)
Immediate Anesthesia Transfer of Care Note  Patient: OLIWIA BERZINS  Procedure(s) Performed: Excision of left posterior auricular mass (Left Ear)  Patient Location: PACU  Anesthesia Type:General  Level of Consciousness: awake and patient cooperative  Airway & Oxygen Therapy: Patient Spontanous Breathing and Patient connected to face mask oxygen  Post-op Assessment: Report given to RN, Post -op Vital signs reviewed and stable, Patient moving all extremities X 4 and Patient able to stick tongue midline  Post vital signs: Reviewed and stable  Last Vitals:  Vitals Value Taken Time  BP 131/92 03/04/20 1125  Temp    Pulse 87 03/04/20 1127  Resp 10 03/04/20 1127  SpO2 100 % 03/04/20 1127  Vitals shown include unvalidated device data.  Last Pain:  Vitals:   03/04/20 0914  TempSrc: Oral  PainSc: 0-No pain         Complications: No complications documented.

## 2020-03-04 NOTE — Anesthesia Procedure Notes (Signed)
Procedure Name: LMA Insertion Date/Time: 03/04/2020 10:22 AM Performed by: Salomon Mast, CRNA Pre-anesthesia Checklist: Patient identified, Emergency Drugs available, Suction available and Patient being monitored Patient Re-evaluated:Patient Re-evaluated prior to induction Oxygen Delivery Method: Circle system utilized Preoxygenation: Pre-oxygenation with 100% oxygen Induction Type: IV induction Ventilation: Mask ventilation without difficulty LMA: LMA inserted LMA Size: 4.0 Number of attempts: 1 Placement Confirmation: positive ETCO2 and breath sounds checked- equal and bilateral Tube secured with: Tape

## 2020-03-05 ENCOUNTER — Encounter (HOSPITAL_BASED_OUTPATIENT_CLINIC_OR_DEPARTMENT_OTHER): Payer: Self-pay | Admitting: Plastic Surgery

## 2020-03-05 LAB — SURGICAL PATHOLOGY

## 2020-03-12 ENCOUNTER — Other Ambulatory Visit: Payer: Self-pay

## 2020-03-12 ENCOUNTER — Encounter: Payer: Self-pay | Admitting: Plastic Surgery

## 2020-03-12 ENCOUNTER — Ambulatory Visit (INDEPENDENT_AMBULATORY_CARE_PROVIDER_SITE_OTHER): Payer: Medicaid Other | Admitting: Plastic Surgery

## 2020-03-12 VITALS — BP 109/75 | HR 86 | Temp 98.4°F

## 2020-03-12 DIAGNOSIS — H938X2 Other specified disorders of left ear: Secondary | ICD-10-CM

## 2020-03-12 NOTE — Progress Notes (Signed)
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  She feels like she is doing well.  On exam the incision is healing fine.  There is a bit of bunching up of the edges that was done to account for the redundancy.  I tried to keep the scar in the posterior auricular area so that it would be hidden.  I clipped the Monocryl knots and would expect these to gradually dissolve.  I reviewed the benign pathology with the patient.  I explained that if the scar did not smooth out to her satisfaction I be happy to see her again in a few months to check it and evaluate if any revisions would be beneficial.  She is fully understanding and will see her again on an as-needed basis.

## 2022-03-20 IMAGING — US US SOFT TISSUE HEAD/NECK
1 series · 12 of 12 positions shown · non-contrast
Comparison: No pertinent prior exams available for comparison.

CLINICAL DATA: Provided history: Left posterior auricular mass.
Additional history provided by scanning technologist: Palpable and
visible mass in left postauricular area for 3 years.

EXAM:
ULTRASOUND OF HEAD/NECK SOFT TISSUES
TECHNIQUE: Ultrasound examination of the head and neck soft tissues was
performed in the area of clinical concern.

[Series 1: us soft tissue head & neck (non-thyroid) · 12 acquisitions, 12 frames shown]
[im 1/12]
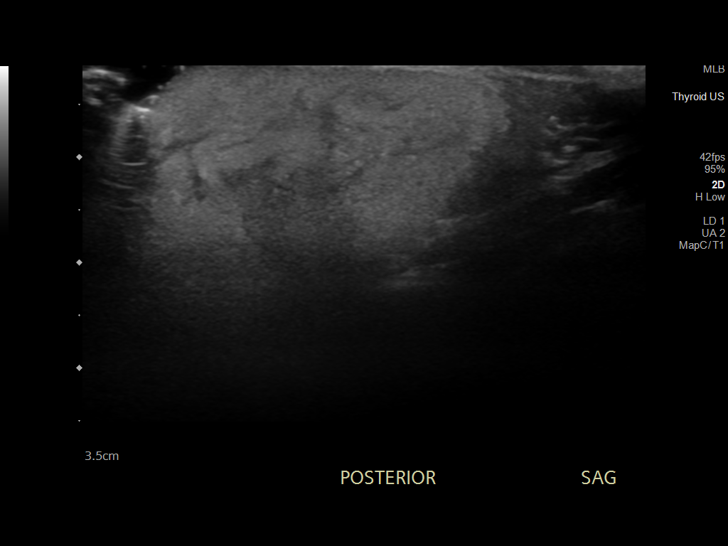
[im 2/12]
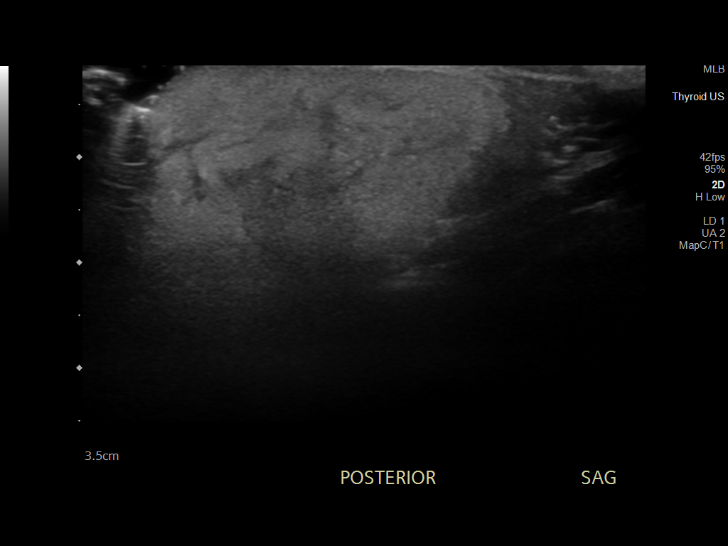
[im 3/12]
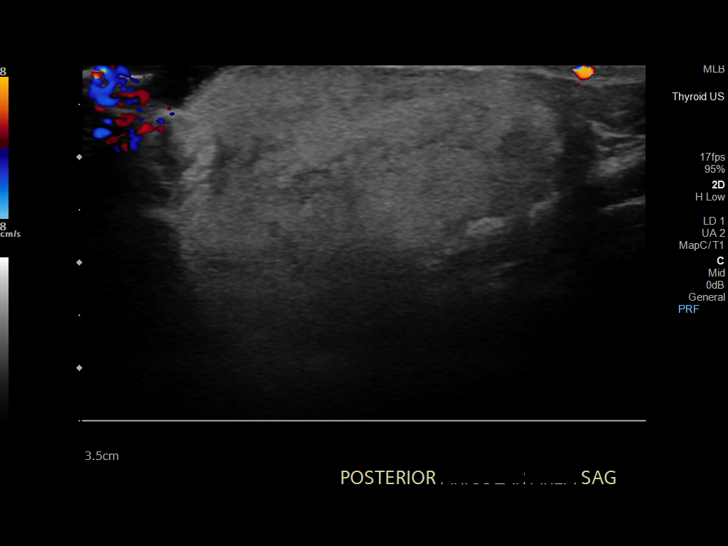
[im 4/12]
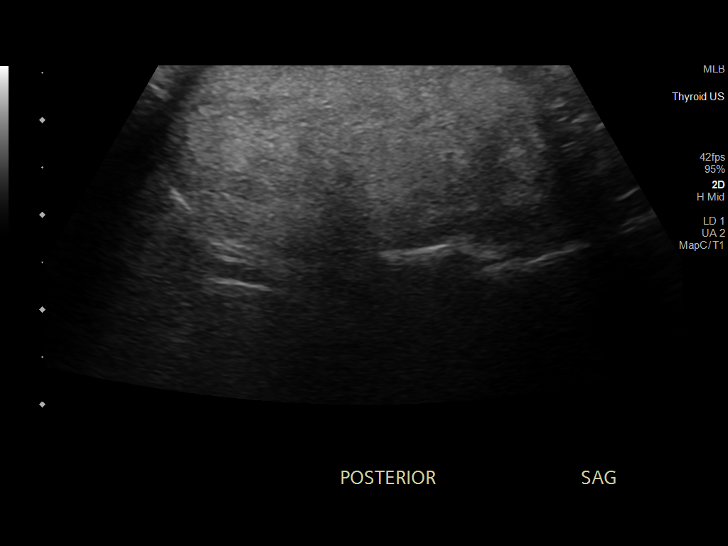
[im 5/12]
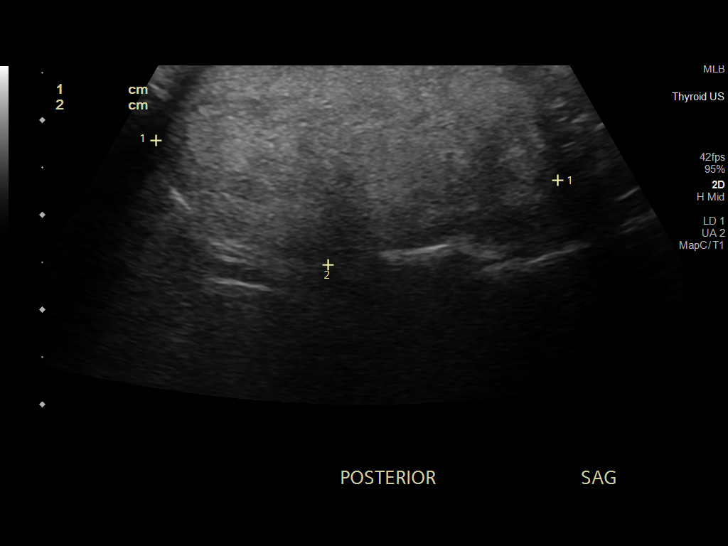
[im 6/12]
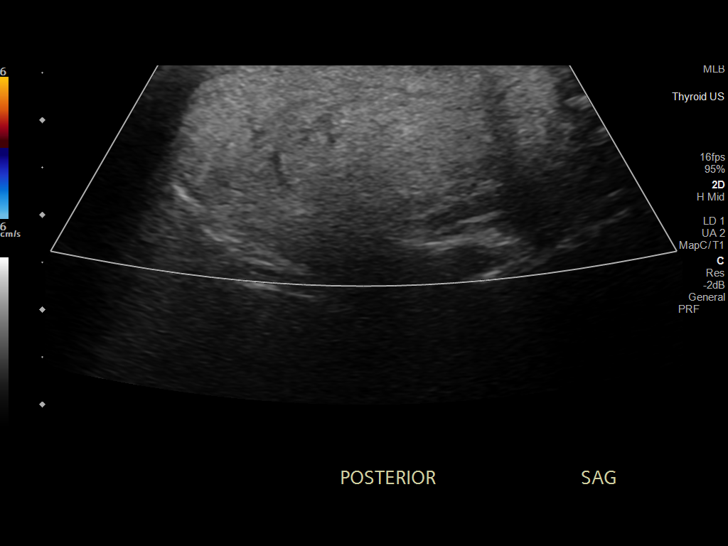
[im 7/12]
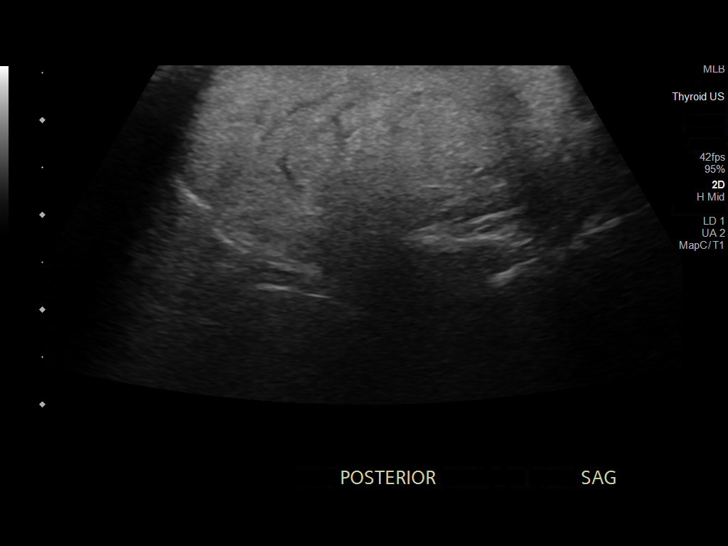
[im 8/12]
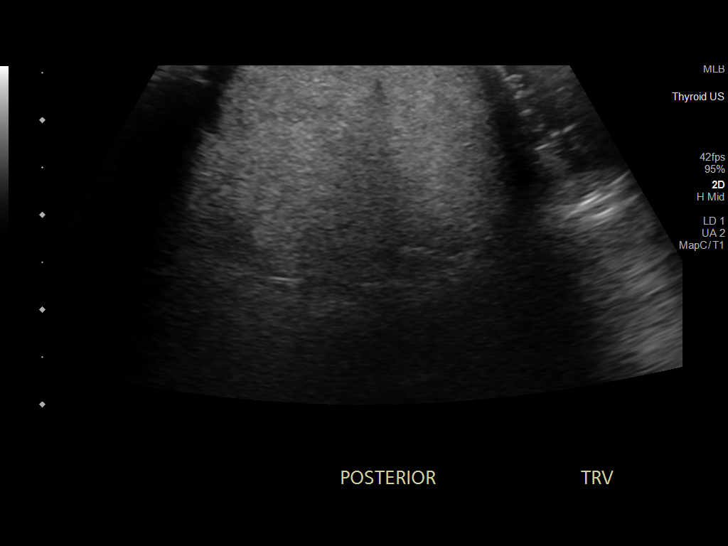
[im 9/12]
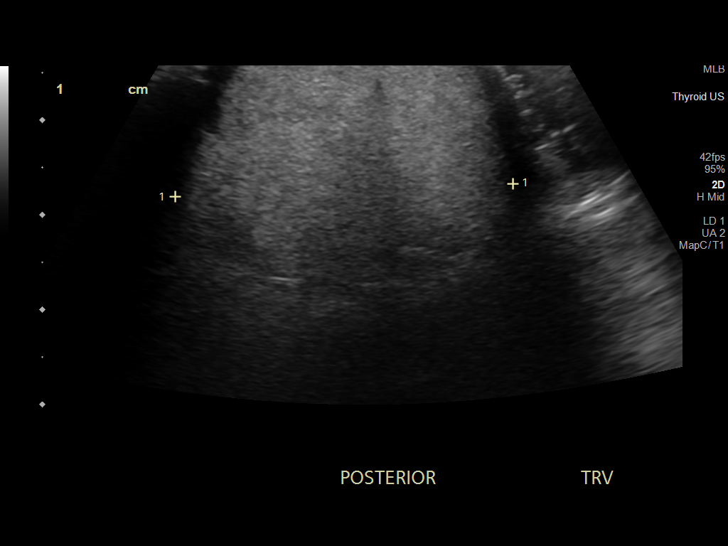
[im 10/12]
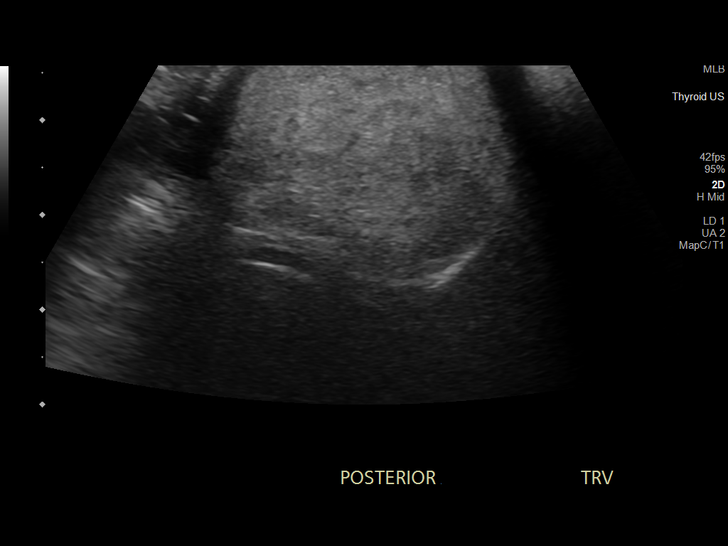
[im 11/12]
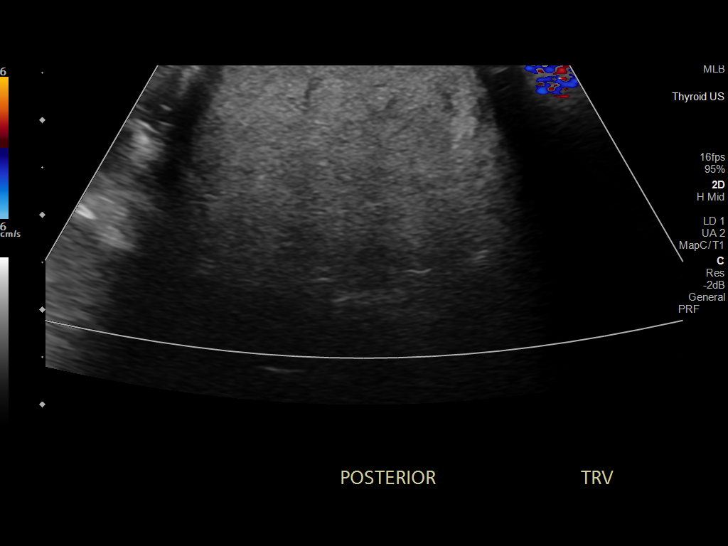
[im 12/12]
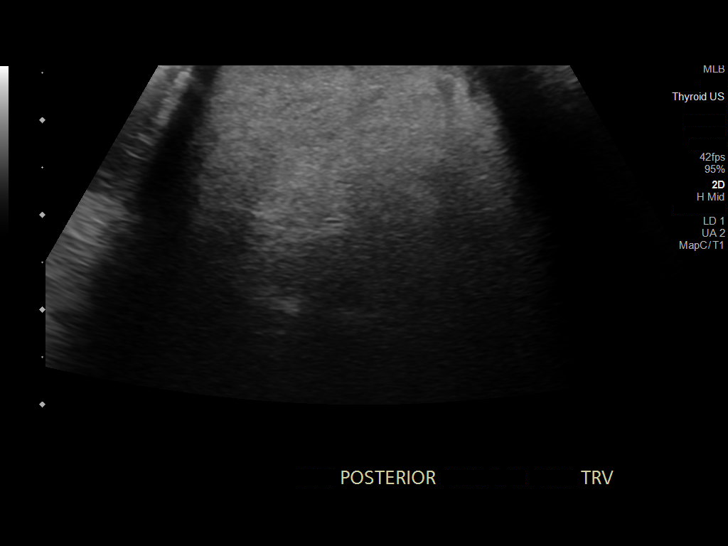

[12 of 12 positions shown; findings below may reference images not displayed]

FINDINGS: Targeted ultrasound was performed in the left postauricular region
of concern. At this site, there is a well-circumscribed ovoid
subcutaneous mass measuring 4.3 x 2.4 x 3.6 cm with slightly
heterogeneous echotexture.
IMPRESSION: Nonspecific 4.3 cm ovoid subcutaneous mass within the left
postauricular region of concern, which may be secondary to benign or
malignant etiologies. A contrast-enhanced neck CT (to include this
region) is recommended for further evaluation, if the patient is
able to have one.
